# Patient Record
Sex: Female | Born: 1970
Health system: Southern US, Community
[De-identification: ages and names within clinical notes are randomized; demographics above are authoritative.]

## PROBLEM LIST (undated history)

## (undated) DIAGNOSIS — G43909 Migraine, unspecified, not intractable, without status migrainosus: Secondary | ICD-10-CM

## (undated) DIAGNOSIS — F32A Depression, unspecified: Secondary | ICD-10-CM

## (undated) DIAGNOSIS — R001 Bradycardia, unspecified: Secondary | ICD-10-CM

## (undated) DIAGNOSIS — J45909 Unspecified asthma, uncomplicated: Secondary | ICD-10-CM

## (undated) DIAGNOSIS — R55 Syncope and collapse: Secondary | ICD-10-CM

## (undated) DIAGNOSIS — C801 Malignant (primary) neoplasm, unspecified: Secondary | ICD-10-CM

## (undated) DIAGNOSIS — F419 Anxiety disorder, unspecified: Secondary | ICD-10-CM

## (undated) HISTORY — DX: Depression, unspecified: F32.A

## (undated) HISTORY — DX: Migraine, unspecified, not intractable, without status migrainosus: G43.909

## (undated) HISTORY — DX: Unspecified asthma, uncomplicated: J45.909

## (undated) HISTORY — DX: Anxiety disorder, unspecified: F41.9

## (undated) HISTORY — PX: TOE SURGERY: SHX1073

## (undated) HISTORY — PX: COLONOSCOPY: SHX174

## (undated) HISTORY — DX: Bradycardia, unspecified: R00.1

## (undated) HISTORY — DX: Malignant (primary) neoplasm, unspecified: C80.1

## (undated) HISTORY — DX: Syncope and collapse: R55

---
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
## 1999-04-26 ENCOUNTER — Other Ambulatory Visit: Admission: RE | Admit: 1999-04-26 | Discharge: 1999-04-26 | Payer: Self-pay | Admitting: Obstetrics and Gynecology

## 1999-09-29 ENCOUNTER — Encounter: Payer: Self-pay | Admitting: Orthopedic Surgery

## 1999-09-29 ENCOUNTER — Ambulatory Visit (HOSPITAL_COMMUNITY): Admission: RE | Admit: 1999-09-29 | Discharge: 1999-09-29 | Payer: Self-pay | Admitting: Orthopedic Surgery

## 2000-06-21 ENCOUNTER — Other Ambulatory Visit: Admission: RE | Admit: 2000-06-21 | Discharge: 2000-06-21 | Payer: Self-pay | Admitting: Obstetrics and Gynecology

## 2001-06-01 ENCOUNTER — Other Ambulatory Visit: Admission: RE | Admit: 2001-06-01 | Discharge: 2001-06-01 | Payer: Self-pay | Admitting: Obstetrics and Gynecology

## 2001-10-29 ENCOUNTER — Other Ambulatory Visit: Admission: RE | Admit: 2001-10-29 | Discharge: 2001-10-29 | Payer: Self-pay | Admitting: Obstetrics & Gynecology

## 2002-12-17 ENCOUNTER — Other Ambulatory Visit: Admission: RE | Admit: 2002-12-17 | Discharge: 2002-12-17 | Payer: Self-pay | Admitting: Obstetrics & Gynecology

## 2006-02-28 ENCOUNTER — Encounter (INDEPENDENT_AMBULATORY_CARE_PROVIDER_SITE_OTHER): Payer: Self-pay | Admitting: *Deleted

## 2006-02-28 ENCOUNTER — Ambulatory Visit (HOSPITAL_COMMUNITY): Admission: RE | Admit: 2006-02-28 | Discharge: 2006-02-28 | Payer: Self-pay | Admitting: *Deleted

## 2006-06-19 ENCOUNTER — Ambulatory Visit (HOSPITAL_COMMUNITY): Admission: RE | Admit: 2006-06-19 | Discharge: 2006-06-19 | Payer: Self-pay | Admitting: *Deleted

## 2007-01-12 ENCOUNTER — Encounter: Admission: RE | Admit: 2007-01-12 | Discharge: 2007-01-12 | Payer: Self-pay | Admitting: Family Medicine

## 2007-02-19 ENCOUNTER — Encounter (INDEPENDENT_AMBULATORY_CARE_PROVIDER_SITE_OTHER): Payer: Self-pay | Admitting: *Deleted

## 2007-02-19 ENCOUNTER — Ambulatory Visit (HOSPITAL_COMMUNITY): Admission: RE | Admit: 2007-02-19 | Discharge: 2007-02-19 | Payer: Self-pay | Admitting: *Deleted

## 2010-04-07 ENCOUNTER — Ambulatory Visit: Payer: Self-pay | Admitting: Internal Medicine

## 2010-04-07 ENCOUNTER — Observation Stay (HOSPITAL_COMMUNITY): Admission: EM | Admit: 2010-04-07 | Discharge: 2010-04-08 | Payer: Self-pay | Admitting: Emergency Medicine

## 2010-04-08 ENCOUNTER — Encounter (INDEPENDENT_AMBULATORY_CARE_PROVIDER_SITE_OTHER): Payer: Self-pay | Admitting: Emergency Medicine

## 2010-04-08 HISTORY — PX: DOBUTAMINE STRESS ECHO: SHX5426

## 2010-04-16 ENCOUNTER — Emergency Department (HOSPITAL_COMMUNITY): Admission: EM | Admit: 2010-04-16 | Discharge: 2010-04-16 | Payer: Self-pay | Admitting: Emergency Medicine

## 2010-04-19 ENCOUNTER — Ambulatory Visit: Payer: Self-pay | Admitting: Cardiovascular Disease

## 2010-11-19 LAB — URINE CULTURE
Culture  Setup Time: 201108121802
Culture: NO GROWTH

## 2010-11-19 LAB — DIFFERENTIAL
Basophils Relative: 0 % (ref 0–1)
Basophils Relative: 0 % (ref 0–1)
Eosinophils Absolute: 0.2 10*3/uL (ref 0.0–0.7)
Eosinophils Absolute: 0.2 10*3/uL (ref 0.0–0.7)
Eosinophils Relative: 2 % (ref 0–5)
Eosinophils Relative: 2 % (ref 0–5)
Lymphs Abs: 2.4 10*3/uL (ref 0.7–4.0)
Lymphs Abs: 2.5 10*3/uL (ref 0.7–4.0)
Monocytes Absolute: 0.5 10*3/uL (ref 0.1–1.0)
Monocytes Relative: 6 % (ref 3–12)
Neutrophils Relative %: 61 % (ref 43–77)

## 2010-11-19 LAB — BASIC METABOLIC PANEL
BUN: 13 mg/dL (ref 6–23)
CO2: 22 mEq/L (ref 19–32)
CO2: 24 mEq/L (ref 19–32)
Calcium: 8.8 mg/dL (ref 8.4–10.5)
Calcium: 8.9 mg/dL (ref 8.4–10.5)
Chloride: 106 mEq/L (ref 96–112)
Chloride: 110 mEq/L (ref 96–112)
Creatinine, Ser: 0.58 mg/dL (ref 0.4–1.2)
GFR calc Af Amer: >60 mL/min (ref >60)
GFR calc Af Amer: >60 mL/min (ref >60)
GFR calc non Af Amer: >60 mL/min (ref >60)
Glucose, Bld: 82 mg/dL (ref 70–99)
Glucose, Bld: 86 mg/dL (ref 70–99)
Potassium: 3.2 mEq/L — ABNORMAL LOW (ref 3.5–5.1)
Sodium: 138 mEq/L (ref 135–145)
Sodium: 139 mEq/L (ref 135–145)

## 2010-11-19 LAB — CK TOTAL AND CKMB (NOT AT ARMC)
CK, MB: 2.3 ng/mL (ref 0.3–4.0)
Relative Index: INVALID (ref 0.0–2.5)
Total CK: 55 U/L (ref 7–177)
Total CK: 65 U/L (ref 7–177)

## 2010-11-19 LAB — POCT CARDIAC MARKERS
CKMB, poc: <1 ng/mL — ABNORMAL LOW (ref 1.0–8.0)
Myoglobin, poc: 27.5 ng/mL (ref 12–200)
Myoglobin, poc: 30.3 ng/mL (ref 12–200)
Troponin i, poc: <0.05 ng/mL (ref 0.00–0.09)
Troponin i, poc: <0.05 ng/mL (ref 0.00–0.09)

## 2010-11-19 LAB — CBC
HCT: 38.5 % (ref 36.0–46.0)
Hemoglobin: 12.8 g/dL (ref 12.0–15.0)
MCH: 29 pg (ref 26.0–34.0)
MCHC: 33.2 g/dL (ref 30.0–36.0)
MCV: 86.5 fL (ref 78.0–100.0)
MCV: 87.3 fL (ref 78.0–100.0)
Platelets: 206 10*3/uL (ref 150–400)
RBC: 4.41 MIL/uL (ref 3.87–5.11)
RDW: 12.5 % (ref 11.5–15.5)
WBC: 9.1 10*3/uL (ref 4.0–10.5)

## 2010-11-19 LAB — URINALYSIS, ROUTINE W REFLEX MICROSCOPIC
Glucose, UA: NEGATIVE mg/dL
Ketones, ur: NEGATIVE mg/dL
Leukocytes, UA: NEGATIVE
Nitrite: NEGATIVE
pH: 5.5 (ref 5.0–8.0)

## 2010-11-19 LAB — POCT I-STAT, CHEM 8
BUN: 13 mg/dL (ref 6–23)
Calcium, Ion: 1.1 mmol/L — ABNORMAL LOW (ref 1.12–1.32)
TCO2: 22 mmol/L (ref 0–100)

## 2010-11-19 LAB — URINE MICROSCOPIC-ADD ON

## 2010-11-19 LAB — POCT PREGNANCY, URINE: Preg Test, Ur: NEGATIVE

## 2010-11-19 LAB — TROPONIN I: Troponin I: 0.01 ng/mL (ref 0.00–0.06)

## 2011-01-18 NOTE — Op Note (Signed)
[name], Amy Poole              ACCOUNT NO.:  [id number]   MEDICAL RECORD NO.:  [id number]          PATIENT TYPE:  AMB   LOCATION:  SDC                           FACILITY:  [facility]   PHYSICIAN:  [place]burn B. Earlene Plater, M.D.  DATE OF BIRTH:  Oct 13, 1970   DATE OF PROCEDURE:  02/19/2007  DATE OF DISCHARGE:                               OPERATIVE REPORT   PREOPERATIVE DIAGNOSES:  1. Endometrial polyp.  2. Irregular bleeding.   POSTOPERATIVE DIAGNOSES:  1. Endometrial polyp.  2. Irregular bleeding.   PROCEDURE:  Hysteroscopy with resection of endometrial polyp.   SURGEON:  Marina Gravel, MD   ASSISTANT:  None.   ANESTHESIA:  MAC and 10 mL of 1% Nesacaine.   SPECIMENS:  Polyp to pathology.   BLOOD LOSS:  Minimal.   FLUID DEFICIT:  50 mL of sorbitol.   COMPLICATIONS:  Unintentional removal of left Essure implant.  The  patient will be advised of this and the need for a different type of  birth control in the future.   INDICATION:  The patient with a history of endometrial polyps and  previously noted small uterine septum at previous hysteroscopy for  Essure and incidental polyp removal.  The patient returned with a recent  history of irregular bleeding.  Ultrasound showed a focal mass;  sonohysterogram confirmed apparent endometrial polyp.  The patient  advised of the risks of surgery including infection, bleeding, damage to  surrounding organs.   PROCEDURE:  The patient taken to the operating room and MAC anesthesia  obtained.  She was prepped and draped in standard fashion.  Bladder  emptied with an in and out catheter.  Exam under anesthesia showed an  anteverted uterus, no adnexal masses.   Speculum inserted, paracervical block placed, cervix grasped on its  anterior lip and dilated to #21.  Diagnostic hysteroscope was inserted  after being flushed.  Good distention obtained, and both Essure implants  were noted to be in their proper position, each with about 2-3 coils  visible.  There was a polyp arising from the midline of the fundal  region.  My concern was using electrocautery in the presence of the  Essure implants if unintentionally touched could cause a problem by  conduction of current to surrounding structures such as bowel.  Therefore, I initially attempted to remove the polyp with the Randall-  Stone forceps.  I kept the tips of the forceps in the midline, opened  very minimally, and very gently attempted to remove the polyp.  This,  unfortunately, apparently snagged the left Essure implant as it was  removed. It was removed with minimal to almost no resistance.  It was  removed completely, as its entire length from tip to tip was visible  after removal.  Therefore, the cervix was dilated up to allow passage of  the small resectoscope, and the polyp was resected at its base very  carefully, ensuring staying away from the right tube so as not to touch  the implant.   The patient tolerated the procedure well, and otherwise there were no  complications.  She was taken  to the recovery room in a well current and  stable condition.  She will be advised of the findings and issue with  her Essure implant.  I will recommend additional form of birth control  going forward in the future.      Gerri Spore B. Earlene Plater, M.D.  Electronically Signed     WBD/MEDQ  D:  02/19/2007  T:  02/19/2007  Job:  [id number]

## 2011-01-21 NOTE — Op Note (Signed)
Amy Poole, Amy Poole              ACCOUNT NO.:  [id number]   MEDICAL RECORD NO.:  [id number]          PATIENT TYPE:  AMB   LOCATION:  SDC                           FACILITY:  [facility]   PHYSICIAN:  [place] B. Earlene Plater, M.D.  DATE OF BIRTH:  23-Dec-1970   DATE OF PROCEDURE:  02/28/2006  DATE OF DISCHARGE:                                 OPERATIVE REPORT   PREOPERATIVE DIAGNOSIS:  Desires tubal sterilization.   POSTOPERATIVE DIAGNOSES:  Desires tubal sterilization, endometrial polyp,  uterine septum.   PROCEDURE:  Essure tubal sterilization, endometrial polyp removal.   SURGEON:  Marina Gravel, M.D.   ANESTHESIA:  MAC and 20 cc 1% Nesacaine paracervical block.   SPECIMENS:  Endometrial polyp submitted to pathology.   BLOOD LOSS:  Minimal.   FLUID DEFICIT:  100 cc.   COMPLICATIONS:  None.   INDICATION:  The patient desires permanent tubal sterilization.  Was advised  of the failure rate and ectopic risk and potential need for laparoscopy  should Essure placement be unsuccessful.  The patient is aware of the need  for three months of backup birth control, as well as the FDA mandated  hysterosalpingogram at three months postop.  The patient was advised of the  risks of the surgery including infection, bleeding, perforation, damage to  tissue and surrounding organs.   PROCEDURE:  The patient was taken to the operating room and MAC anesthesia  obtained.  She was prepped and draped in standard fashion.  Bladder emptied  with in-and-out catheter.  Exam under anesthesia showed an anteverted normal-  sized uterus, no adnexal masses.   Paracervical block placed.  Single-tooth tenaculum attached to the anterior  lip of the cervix, the Essure scope inserted and a uterine septum  encountered.  Both sides were explored and each tubal ostia was readily  visible.  In addition, there was an endometrial polyp on the left side.   The right side was approached first.  The Essure device inserted in  the  tubal ostia to the black depth marker and released in standard fashion.  Five coils were visible after placement.  The procedure was repeated on the  left side in the exact same manner and five coils were visible after  placement.  The endometrial polyp was at the fundus on the left side, just  to the left of the septum, and was removed with the grasping forceps through  the Essure scope.  Given that the uterine septum was asymptomatic, it was  left untreated.  Instruments were removed and cervix hemostatic.   The patient tolerated the procedure well without complications.  She was  taken to the recovery room, awake, alert, in stable condition.      Gerri Spore B. Earlene Plater, M.D.  Electronically Signed     WBD/MEDQ  D:  02/28/2006  T:  02/28/2006  Job:  [id number]

## 2011-06-22 LAB — DIFFERENTIAL
Basophils Relative: 1
Eosinophils Absolute: 0.2
Monocytes Absolute: 0.4
Monocytes Relative: 7

## 2011-06-22 LAB — CBC
Hemoglobin: 13
MCHC: 34.2
MCV: 84.9
RBC: 4.49
RDW: 12.6

## 2011-09-05 ENCOUNTER — Ambulatory Visit: Payer: Self-pay

## 2011-09-05 DIAGNOSIS — S40029A Contusion of unspecified upper arm, initial encounter: Secondary | ICD-10-CM

## 2012-01-11 ENCOUNTER — Ambulatory Visit: Payer: 59 | Admitting: Family Medicine

## 2012-01-11 VITALS — BP 114/71 | HR 48 | Temp 98.0°F | Resp 18 | Ht 64.0 in | Wt 179.0 lb

## 2012-01-11 DIAGNOSIS — K645 Perianal venous thrombosis: Secondary | ICD-10-CM

## 2012-01-11 NOTE — Progress Notes (Signed)
  Patient Name: Amy Poole Date of Birth: [birth year] Medical Record Number: [id number] Gender: female Date of Encounter: 01/11/2012  History of Present Illness:  Amy Poole is a 41 y.o. very pleasant female patient who presents with the following:  Here today with hemorrhoids which started to flare up about 10 days ago- then last Thursday they started to bleed. She has bright red blood with BM and sometimes in between as well.  She does not have a lot of pain- however she did have significant pain when the flare- up started.    She is on her feet a lot at work and runs for exercise.    She has had hemorrhoids off and on since her teen years.   She has been stressed especially as her marriage came apart last week.   She uses an IUD for contraception- will be due to have removed soon.    There is no problem list on file for this patient.  No past medical history on file. No past surgical history on file. History  Substance Use Topics  . Smoking status: Never Smoker   . Smokeless tobacco: Not on file  . Alcohol Use: Not on file   No family history on file. Allergies  Allergen Reactions  . Codeine   . Penicillins     Medication list has been reviewed and updated.  Review of Systems: As per HPI- otherwise negative. No other symptoms, abdominal pain, etc  Physical Examination: Filed Vitals:   01/11/12 1008  BP: 114/71  Pulse: 48  Temp: 98 F (36.7 C)  TempSrc: Oral  Resp: 18  Height: 5\' 4"  (1.626 m)  Weight: 179 lb (81.194 kg)    Body mass index is 30.73 kg/(m^2).   GEN: WDWN, NAD, Non-toxic, Alert & Oriented x 3 HEENT: Atraumatic, Normocephalic.  Ears and Nose: No external deformity. EXTR: No clubbing/cyanosis/edema NEURO: Normal gait.  PSYCH: Normally interactive. Conversant. Not depressed or anxious appearing.  Calm demeanor.  Rectal: there is a thrombosed hemmorhoid with a visible blood clot- the hemorrhoid has already opened but clot is  retained  Procedure: VC obtained. Anesthesia with a small amount of 1% lidocaine.  Squeezed hemorrhoid and used forceps to remove clot- extended open area very slightly with 11 blade  Assessment and Plan: 1. Hemorrhoid thrombosis    Treated thrombosed hemorrhoid as above.  Discussed sits baths and keeping stool soft.she will follow- up if she is not doing better- Sooner if worse.

## 2012-01-23 ENCOUNTER — Encounter: Payer: Self-pay | Admitting: *Deleted

## 2013-01-03 ENCOUNTER — Ambulatory Visit (INDEPENDENT_AMBULATORY_CARE_PROVIDER_SITE_OTHER): Payer: BC Managed Care – PPO | Admitting: General Surgery

## 2013-01-03 ENCOUNTER — Encounter (INDEPENDENT_AMBULATORY_CARE_PROVIDER_SITE_OTHER): Payer: Self-pay | Admitting: General Surgery

## 2013-01-03 VITALS — BP 122/90 | HR 61 | Temp 97.1°F | Ht 64.0 in | Wt 183.8 lb

## 2013-01-03 DIAGNOSIS — L0591 Pilonidal cyst without abscess: Secondary | ICD-10-CM

## 2013-01-03 NOTE — Progress Notes (Signed)
Patient ID: Amy Poole, female   DOB: [birth year], 42 y.o.   MRN: [id number]  Chief Complaint  Patient presents with  . New Evaluation    eval pil cyst    HPI Amy Poole is a 42 y.o. female.  This patient is referred by Dr. Collins Scotland for evaluation of a pilonidal cyst. She says that she has had this for several years and about one time per year this will get swollen and inflamed and required incision and drainage. She says that she's had these lanced about 7-8 times. After she has the drainage of her symptoms seem to improve and then she does not have any drainage he in between episodes. Most recently this occurred about 2 weeks ago and she treated this with warming the area and she did not require incision and drainage. She denies any fever or chills or other associated symptoms HPI  Past Medical History  Diagnosis Date  . Syncope and collapse   . Bradycardia   . Chest pain     Past Surgical History  Procedure Laterality Date  . Dobutamine stress echo  04/08/2010    NORMAL  . Appendectomy  07/1983    Family History  Problem Relation Age of Onset  . Hypertension Mother   . Diabetes Father   . Heart disease Father   . Cancer Paternal Grandmother     breast    Social History History  Substance Use Topics  . Smoking status: Never Smoker   . Smokeless tobacco: Not on file  . Alcohol Use: Yes     Comment: 1-2 month    Allergies  Allergen Reactions  . Codeine   . Penicillins     Current Outpatient Prescriptions  Medication Sig Dispense Refill  . albuterol (PROVENTIL) (2.5 MG/3ML) 0.083% nebulizer solution Take 2.5 mg by nebulization every 6 (six) hours as needed.      Marland Kitchen aspirin-acetaminophen-caffeine (EXCEDRIN MIGRAINE) 250-250-65 MG per tablet Take 1 tablet by mouth every 6 (six) hours as needed for pain.      . cetirizine (ZYRTEC) 10 MG tablet Take 10 mg by mouth daily.       No current facility-administered medications for this visit.    Review of  Systems Review of Systems All other review of systems negative or noncontributory except as stated in the HPI  Blood pressure 122/90, pulse 61, temperature 97.1 F (36.2 C), temperature source Temporal, height 5\' 4"  (1.626 m), weight 183 lb 12.8 oz (83.371 kg), SpO2 98.00%.  Physical Exam Physical Exam Physical Exam  Nursing note and vitals reviewed. Constitutional: She is oriented to person, place, and time. She appears well-developed and well-nourished. No distress.  HENT:  Head: Normocephalic and atraumatic.  Mouth/Throat: No oropharyngeal exudate.  Eyes: Conjunctivae and EOM are normal. Pupils are equal, round, and reactive to light. Right eye exhibits no discharge. Left eye exhibits no discharge. No scleral icterus.  Neck: Normal range of motion. Neck supple. No tracheal deviation present.  Cardiovascular: Normal rate, regular rhythm, normal heart sounds and intact distal pulses.   Pulmonary/Chest: Effort normal and breath sounds normal. No stridor. No respiratory distress. She has no wheezes.  Abdominal: Soft. Bowel sounds are normal. She exhibits no distension and no mass. There is no tenderness. There is no rebound and no guarding.  Musculoskeletal: Normal range of motion. She exhibits no edema and no tenderness.  Neurological: She is alert and oriented to person, place, and time.  Skin: Skin is warm and dry. No rash  noted. She is not diaphoretic. No erythema. No pallor. She hasno tenderness in the area of concern. She does have some well-healed scars just to the left of midline which are well healed. There is no evidence of any active infection or drainage. She does have a midline sinus consistent with a pilonidal cyst. There is no evidence of erythema or induration or tenderness. Psychiatric: She has a normal mood and affect. Her behavior is normal. Judgment and thought content normal.    Data Reviewed Outside records   Assessment    Pilonidal cyst with history of  abscess She has fairly classic pilonidal cyst from her history as well as on exam. Fortunately, there is no evidence of active infection currently. We had a discussion about the options for continued observation versus elective pilonidal cystectomy in think that she would like to proceed with pilonidal cystectomy. However she would like to wait on scheduling this for now until she can work with her insurance and with her work to find at the time to have this repaired. We did discuss the procedure and its risks including infection, bleeding, pain, scarring, recurrence, chronic wound. She expressed understanding of the high risk of wound infection for this procedure and I will wait to hear from her about when she would like to have this scheduled     Plan    We will plan for elective pilonidal cystectomy at her convenience        Lodema Pilot DAVID 01/03/2013, 3:20 PM

## 2013-11-18 ENCOUNTER — Encounter: Payer: Self-pay | Admitting: Diagnostic Neuroimaging

## 2013-11-18 ENCOUNTER — Ambulatory Visit (INDEPENDENT_AMBULATORY_CARE_PROVIDER_SITE_OTHER): Payer: Self-pay | Admitting: Diagnostic Neuroimaging

## 2013-11-18 ENCOUNTER — Encounter (INDEPENDENT_AMBULATORY_CARE_PROVIDER_SITE_OTHER): Payer: Self-pay

## 2013-11-18 VITALS — BP 107/70 | HR 69 | Ht 64.0 in | Wt 188.0 lb

## 2013-11-18 DIAGNOSIS — R5383 Other fatigue: Secondary | ICD-10-CM

## 2013-11-18 DIAGNOSIS — R200 Anesthesia of skin: Secondary | ICD-10-CM

## 2013-11-18 DIAGNOSIS — R5381 Other malaise: Secondary | ICD-10-CM

## 2013-11-18 DIAGNOSIS — R209 Unspecified disturbances of skin sensation: Secondary | ICD-10-CM

## 2013-11-18 DIAGNOSIS — R531 Weakness: Secondary | ICD-10-CM

## 2013-11-18 NOTE — Patient Instructions (Signed)
I will check MRI brain.  Try to reduce your rigorous work schedule to no more than 50-60 hours per week.

## 2013-11-18 NOTE — Progress Notes (Signed)
[facility]  PATIENT: Amy Poole DOB: 1970-12-12  REFERRING CLINICIAN: Cobb HISTORY FROM: patient  REASON FOR VISIT: new consult   HISTORICAL  CHIEF COMPLAINT:  Chief Complaint  Patient presents with  . Neurologic Problem    Numbness, tingling in arms and feet..#6    HISTORY OF PRESENT ILLNESS:   43 year old left-handed female here for evaluation of numbness and tingling.  For past 5-6 weeks patient has had a left greater than right, hand and arm, feet numbness and tingling. Symptoms started 1 day 5-6 weeks ago. Symptoms have been consistent since that time. Patient feels some mild weakness diffusely. Also having some dizziness, blurred vision and weight loss.  Other factors include significant increase in hours worked per week since January 2015. Patient previously was working 50-60 hours per week. Now she is working 90 hours per week, 6-7 days per week. Patient is getting 5 hours of sleep on 3-4 nights per week.  REVIEW OF SYSTEMS: Full 14 system review of systems performed and notable only for numbness weakness dizziness passing out blurred vision weight loss. 15 pound weight loss since January.  ALLERGIES: Allergies  Allergen Reactions  . Codeine   . Penicillins     HOME MEDICATIONS: Outpatient Prescriptions Prior to Visit  Medication Sig Dispense Refill  . albuterol (PROVENTIL) (2.5 MG/3ML) 0.083% nebulizer solution Take 2.5 mg by nebulization every 6 (six) hours as needed.      Marland Kitchen aspirin-acetaminophen-caffeine (EXCEDRIN MIGRAINE) 250-250-65 MG per tablet Take 1 tablet by mouth every 6 (six) hours as needed for pain.      . cetirizine (ZYRTEC) 10 MG tablet Take 10 mg by mouth daily.       No facility-administered medications prior to visit.    PAST MEDICAL HISTORY: Past Medical History  Diagnosis Date  . Syncope and collapse   . Bradycardia   . Chest pain     PAST SURGICAL HISTORY: Past Surgical History  Procedure Laterality Date   . Dobutamine stress echo  04/08/2010    NORMAL  . Appendectomy  07/1983    FAMILY HISTORY: Family History  Problem Relation Age of Onset  . Hypertension Mother   . Diabetes Father   . Heart disease Father   . Cancer Paternal Grandmother     breast    SOCIAL HISTORY:  History   Social History  . Marital Status: Legally Separated    Spouse Name: N/A    Number of Children: 0  . Years of Education: 12th   Occupational History  . MANAGER    Social History Main Topics  . Smoking status: Never Smoker   . Smokeless tobacco: Not on file  . Alcohol Use: Yes     Comment: 1-2 month  . Drug Use: No  . Sexual Activity: Not on file   Other Topics Concern  . Not on file   Social History Narrative  . No narrative on file     PHYSICAL EXAM  Filed Vitals:   11/18/13 1009  BP: 107/70  Pulse: 69  Height: _0  (1.626 m)  Weight: 188 lb (85.276 kg)    Not recorded    Body mass index is 32.25 kg/(m^2).  GENERAL EXAM: Patient is in no distress; well developed, nourished and groomed; neck is supple; APPEARS EXHAUSTED. SOFT SPOKEN.  CARDIOVASCULAR: Regular rate and rhythm, no murmurs, no carotid bruits  NEUROLOGIC: MENTAL STATUS: awake, alert, oriented to person, place and time, recent and remote memory intact, normal attention and concentration,  language fluent, comprehension intact, naming intact, fund of knowledge appropriate CRANIAL NERVE: no papilledema on fundoscopic exam, pupils equal and reactive to light, visual fields full to confrontation, extraocular muscles intact, no nystagmus, facial sensation and strength symmetric, hearing intact, palate elevates symmetrically, uvula midline, shoulder shrug symmetric, tongue midline. MOTOR: normal bulk and tone, full strength in the BUE, BLE SENSORY: normal and symmetric to light touch, pinprick, temperature, vibration COORDINATION: finger-nose-finger, fine finger movements normal REFLEXES: deep tendon reflexes present and  symmetric GAIT/STATION: narrow based gait; able to walk on toes, heels and tandem; romberg is negative    DIAGNOSTIC DATA (LABS, IMAGING, TESTING) - I reviewed patient records, labs, notes, testing and imaging myself where available.  Lab Results  Component Value Date   WBC 8.1 04/16/2010   HGB 12.8 04/16/2010   HCT 38.5 04/16/2010   MCV 87.3 04/16/2010   PLT 200 04/16/2010      Component Value Date/Time   NA 139 04/16/2010 1644   K 3.8 04/16/2010 1644   CL 110 04/16/2010 1644   CO2 24 04/16/2010 1644   GLUCOSE 86 04/16/2010 1644   BUN 14 04/16/2010 1644   CREATININE 0.58 04/16/2010 1644   CALCIUM 8.8 04/16/2010 1644   GFRNONAA >60 04/16/2010 1644   GFRAA  Value: >60        The eGFR has been calculated using the MDRD equation. This calculation has not been validated in all clinical situations. eGFR's persistently <60 mL/min signify possible Chronic Kidney Disease. 04/16/2010 1644   No results found for this basename: CHOL,  HDL,  LDLCALC,  LDLDIRECT,  TRIG,  CHOLHDL   No results found for this basename: HGBA1C   No results found for this basename: VITAMINB12   No results found for this basename: TSH   B12 465 A1C 4.9 CBC, IRON, CMP - normal  ASSESSMENT AND PLAN  43 y.o. year old female here with new onset numbness in left greater than right side, mainly in hands and feet. Neurologic examination unremarkable. Patient is significantly exhausted related to her rigorous work schedule.  Ddx: CNS autoimmune, inflammatory, structural, metabolic, sleep deprivation, overexertion  PLAN: - MRI brain - Try to reduce work schedule and increase sleep  Orders Placed This Encounter  Procedures  . MR Brain Wo Contrast   Return in about 6 weeks (around 12/30/2013).    Penni Bombard, MD 03/24/9105, 81:66 AM Certified in Neurology, Neurophysiology and Neuroimaging  [facility] [address] [place], [place] [address] [phone]

## 2013-11-27 DIAGNOSIS — R209 Unspecified disturbances of skin sensation: Secondary | ICD-10-CM

## 2013-11-28 ENCOUNTER — Ambulatory Visit
Admission: RE | Admit: 2013-11-28 | Discharge: 2013-11-28 | Disposition: A | Discharge status: Home / Self Care | Payer: PRIVATE HEALTH INSURANCE | Source: Ambulatory Visit | Attending: Diagnostic Neuroimaging | Admitting: Diagnostic Neuroimaging

## 2013-11-28 DIAGNOSIS — R531 Weakness: Secondary | ICD-10-CM

## 2013-11-28 DIAGNOSIS — R200 Anesthesia of skin: Secondary | ICD-10-CM

## 2013-12-10 ENCOUNTER — Telehealth: Payer: Self-pay | Admitting: Diagnostic Neuroimaging

## 2013-12-10 NOTE — Telephone Encounter (Signed)
I spoke to the patient and gave the MRI results. She is requesting a letter to be addressed to her work place to be mailed to her home address advising to limit her work hours to less than 50 per week. She was advised to keep her scheduled appointment with Dr. Corwin Levins   in a few weeks

## 2013-12-10 NOTE — Telephone Encounter (Signed)
Pt called would like for someone to call her back concerning her MRI that was done on 11/28/13. Thanks

## 2013-12-10 NOTE — Telephone Encounter (Signed)
Pt is calling requesting MRI results, sending to [facility], Dr. Leonie Man, please advise

## 2013-12-11 ENCOUNTER — Encounter: Payer: Self-pay | Admitting: *Deleted

## 2013-12-11 NOTE — Telephone Encounter (Signed)
Mailed letter to pt

## 2013-12-18 ENCOUNTER — Encounter: Payer: Self-pay | Admitting: Diagnostic Neuroimaging

## 2013-12-18 ENCOUNTER — Ambulatory Visit (INDEPENDENT_AMBULATORY_CARE_PROVIDER_SITE_OTHER): Payer: Self-pay | Admitting: Diagnostic Neuroimaging

## 2013-12-18 VITALS — BP 108/70 | HR 80 | Ht 64.0 in | Wt 188.0 lb

## 2013-12-18 DIAGNOSIS — R200 Anesthesia of skin: Secondary | ICD-10-CM

## 2013-12-18 DIAGNOSIS — R209 Unspecified disturbances of skin sensation: Secondary | ICD-10-CM

## 2013-12-18 NOTE — Progress Notes (Signed)
[facility]  PATIENT: Amy Poole DOB: 1971-02-27  REFERRING CLINICIAN: Cobb HISTORY FROM: patient  REASON FOR VISIT: new consult   HISTORICAL  CHIEF COMPLAINT:  Chief Complaint  Patient presents with  . Follow-up    numbness, weakness    HISTORY OF PRESENT ILLNESS:   UPDATE 12/18/13: Symptoms are slightly improving. Work schedule has been slightly better. Testing results reviewed. Still with numbness in hands and feet.   PRIOR HPI (11/18/13:  43 year old left-handed female here for evaluation of numbness and tingling. For past 5-6 weeks patient has had a left greater than right, hand and arm, feet numbness and tingling. Symptoms started 1 day 5-6 weeks ago. Symptoms have been consistent since that time. Patient feels some mild weakness diffusely. Also having some dizziness, blurred vision and weight loss. Other factors include significant increase in hours worked per week since January 2015. Patient previously was working 50-60 hours per week. Now she is working 90 hours per week, 6-7 days per week. Patient is getting 5 hours of sleep on 3-4 nights per week.   REVIEW OF SYSTEMS: Full 14 system review of systems performed and notable only for numbness weakness dizziness passing out blurred vision weight loss. 15 pound weight loss since January.  ALLERGIES: Allergies  Allergen Reactions  . Codeine   . Penicillins     HOME MEDICATIONS: Outpatient Prescriptions Prior to Visit  Medication Sig Dispense Refill  . albuterol (PROVENTIL) (2.5 MG/3ML) 0.083% nebulizer solution Take 2.5 mg by nebulization every 6 (six) hours as needed.      Marland Kitchen aspirin-acetaminophen-caffeine (EXCEDRIN MIGRAINE) 250-250-65 MG per tablet Take 1 tablet by mouth every 6 (six) hours as needed for pain.      . cetirizine (ZYRTEC) 10 MG tablet Take 10 mg by mouth daily.      . cyclobenzaprine (FLEXERIL) 10 MG tablet Take 10 mg by mouth 3 (three) times daily as needed for muscle spasms.       Marland Kitchen ibuprofen (ADVIL,MOTRIN) 600 MG tablet Take 600 mg by mouth every 6 (six) hours as needed.       No facility-administered medications prior to visit.    PAST MEDICAL HISTORY: Past Medical History  Diagnosis Date  . Syncope and collapse   . Bradycardia   . Chest pain     PAST SURGICAL HISTORY: Past Surgical History  Procedure Laterality Date  . Dobutamine stress echo  04/08/2010    NORMAL  . Appendectomy  07/1983    FAMILY HISTORY: Family History  Problem Relation Age of Onset  . Hypertension Mother   . Diabetes Father   . Heart disease Father   . Cancer Paternal Grandmother     breast    SOCIAL HISTORY:  History   Social History  . Marital Status: Legally Separated    Spouse Name: N/A    Number of Children: 0  . Years of Education: 12th   Occupational History  . MANAGER     [employer]   Social History Main Topics  . Smoking status: Never Smoker   . Smokeless tobacco: Never Used  . Alcohol Use: Yes     Comment: 1-2 month  . Drug Use: No  . Sexual Activity: Not on file   Other Topics Concern  . Not on file   Social History Narrative   Patient lives at home alone.   Caffeine Use: 1-2 sodas every other day     PHYSICAL EXAM  Filed Vitals:   12/18/13 1438  BP: 108/70  Pulse: 80  Height: 5' 4"  (1.626 m)  Weight: 188 lb (85.276 kg)    Not recorded    Body mass index is 32.25 kg/(m^2).  GENERAL EXAM: Patient is in no distress; well developed, nourished and groomed; neck is supple; APPEARS LESS EXHAUSTED. SOFT SPOKEN.  CARDIOVASCULAR: Regular rate and rhythm, no murmurs, no carotid bruits  NEUROLOGIC: MENTAL STATUS: awake, alert, oriented to person, place and time, recent and remote memory intact, normal attention and concentration, language fluent, comprehension intact, naming intact, fund of knowledge appropriate CRANIAL NERVE: no papilledema on fundoscopic exam, pupils equal and reactive to light, visual fields full to  confrontation, extraocular muscles intact, no nystagmus, facial sensation and strength symmetric, hearing intact, palate elevates symmetrically, uvula midline, shoulder shrug symmetric, tongue midline. MOTOR: normal bulk and tone, full strength in the BUE, BLE SENSORY: normal and symmetric to light touch, pinprick, temperature, vibration COORDINATION: finger-nose-finger, fine finger movements normal REFLEXES: deep tendon reflexes present and symmetric GAIT/STATION: narrow based gait; able to walk on toes, heels and tandem; romberg is negative    DIAGNOSTIC DATA (LABS, IMAGING, TESTING) - I reviewed patient records, labs, notes, testing and imaging myself where available.  Lab Results  Component Value Date   WBC 8.1 04/16/2010   HGB 12.8 04/16/2010   HCT 38.5 04/16/2010   MCV 87.3 04/16/2010   PLT 200 04/16/2010      Component Value Date/Time   NA 139 04/16/2010 1644   K 3.8 04/16/2010 1644   CL 110 04/16/2010 1644   CO2 24 04/16/2010 1644   GLUCOSE 86 04/16/2010 1644   BUN 14 04/16/2010 1644   CREATININE 0.58 04/16/2010 1644   CALCIUM 8.8 04/16/2010 1644   GFRNONAA >60 04/16/2010 1644   GFRAA  Value: >60        The eGFR has been calculated using the MDRD equation. This calculation has not been validated in all clinical situations. eGFR's persistently <60 mL/min signify possible Chronic Kidney Disease. 04/16/2010 1644   No results found for this basename: CHOL,  HDL,  LDLCALC,  LDLDIRECT,  TRIG,  CHOLHDL   No results found for this basename: HGBA1C   No results found for this basename: VITAMINB12   No results found for this basename: TSH   B12 465 A1C 4.9 CBC, IRON, CMP - normal  11/28/13 MRI BRAIN - normal   ASSESSMENT AND PLAN  43 y.o. year old female here with new onset numbness in left greater than right side, mainly in hands and feet. Neurologic examination unremarkable. Patient has been significantly exhausted related to her rigorous work schedule. Fortunately, symptoms and  work schedule are improving slowly.  Ddx: sleep deprivation vs overexertion   PLAN: - continue to balance work/life/sleep schedules  Return in about 6 months (around 06/19/2014), or if symptoms worsen or fail to improve.    Penni Bombard, MD 8/38/1840, 3:75 PM Certified in Neurology, Neurophysiology and Neuroimaging  [facility] [address], [place], [place] [address] [phone]

## 2013-12-18 NOTE — Patient Instructions (Signed)
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

## 2013-12-30 ENCOUNTER — Ambulatory Visit: Payer: Self-pay | Admitting: Diagnostic Neuroimaging

## 2014-06-18 ENCOUNTER — Ambulatory Visit: Payer: Self-pay | Admitting: Diagnostic Neuroimaging

## 2014-12-01 ENCOUNTER — Ambulatory Visit (INDEPENDENT_AMBULATORY_CARE_PROVIDER_SITE_OTHER): Payer: 59 | Admitting: Internal Medicine

## 2014-12-01 VITALS — BP 110/70 | HR 68 | Temp 98.2°F | Resp 16 | Ht 66.0 in | Wt 193.0 lb

## 2014-12-01 DIAGNOSIS — R059 Cough, unspecified: Secondary | ICD-10-CM

## 2014-12-01 DIAGNOSIS — R05 Cough: Secondary | ICD-10-CM

## 2014-12-01 DIAGNOSIS — J0140 Acute pansinusitis, unspecified: Secondary | ICD-10-CM

## 2014-12-01 MED ORDER — AZITHROMYCIN 500 MG PO TABS: 500.0000 mg | ORAL_TABLET | Freq: Every day | ORAL | Status: DC

## 2014-12-01 NOTE — Patient Instructions (Signed)
Asthma Attack Prevention Although there is no way to prevent asthma from starting, you can take steps to control the disease and reduce its symptoms. Learn about your asthma and how to control it. Take an active role to control your asthma by working with your health care provider to create and follow an asthma action plan. An asthma action plan guides you in:  Taking your medicines properly.  Avoiding things that set off your asthma or make your asthma worse (asthma triggers).  Tracking your level of asthma control.  Responding to worsening asthma.  Seeking emergency care when needed. To track your asthma, keep records of your symptoms, check your peak flow number using a handheld device that shows how well air moves out of your lungs (peak flow meter), and get regular asthma checkups.  WHAT ARE SOME WAYS TO PREVENT AN ASTHMA ATTACK?  Take medicines as directed by your health care provider.  Keep track of your asthma symptoms and level of control.  With your health care provider, write a detailed plan for taking medicines and managing an asthma attack. Then be sure to follow your action plan. Asthma is an ongoing condition that needs regular monitoring and treatment.  Identify and avoid asthma triggers. Many outdoor allergens and irritants (such as pollen, mold, cold air, and air pollution) can trigger asthma attacks. Find out what your asthma triggers are and take steps to avoid them.  Monitor your breathing. Learn to recognize warning signs of an attack, such as coughing, wheezing, or shortness of breath. Your lung function may decrease before you notice any signs or symptoms, so regularly measure and record your peak airflow with a home peak flow meter.  Identify and treat attacks early. If you act quickly, you are less likely to have a severe attack. You will also need less medicine to control your symptoms. When your peak flow measurements decrease and alert you to an upcoming attack,  take your medicine as instructed and immediately stop any activity that may have triggered the attack. If your symptoms do not improve, get medical help.  Pay attention to increasing quick-relief inhaler use. If you find yourself relying on your quick-relief inhaler, your asthma is not under control. See your health care provider about adjusting your treatment. WHAT CAN MAKE MY SYMPTOMS WORSE? A number of common things can set off or make your asthma symptoms worse and cause temporary increased inflammation of your airways. Keep track of your asthma symptoms for several weeks, detailing all the environmental and emotional factors that are linked with your asthma. When you have an asthma attack, go back to your asthma diary to see which factor, or combination of factors, might have contributed to it. Once you know what these factors are, you can take steps to control many of them. If you have allergies and asthma, it is important to take asthma prevention steps at home. Minimizing contact with the substance to which you are allergic will help prevent an asthma attack. Some triggers and ways to avoid these triggers are: Animal Dander:  Some people are allergic to the flakes of skin or dried saliva from animals with fur or feathers.   There is no such thing as a hypoallergenic dog or cat breed. All dogs or cats can cause allergies, even if they don't shed.  Keep these pets out of your home.  If you are not able to keep a pet outdoors, keep the pet out of your bedroom and other sleeping areas at all   times, and keep the door closed.  Remove carpets and furniture covered with cloth from your home. If that is not possible, keep the pet away from fabric-covered furniture and carpets. Dust Mites: Many people with asthma are allergic to dust mites. Dust mites are tiny bugs that are found in every home in mattresses, pillows, carpets, fabric-covered furniture, bedcovers, clothes, stuffed toys, and other  fabric-covered items.   Cover your mattress in a special dust-proof cover.  Cover your pillow in a special dust-proof cover, or wash the pillow each week in hot water. Water must be hotter than 130 F (54.4 C) to kill dust mites. Cold or warm water used with detergent and bleach can also be effective.  Wash the sheets and blankets on your bed each week in hot water.  Try not to sleep or lie on cloth-covered cushions.  Call ahead when traveling and ask for a smoke-free hotel room. Bring your own bedding and pillows in case the hotel only supplies feather pillows and down comforters, which may contain dust mites and cause asthma symptoms.  Remove carpets from your bedroom and those laid on concrete, if you can.  Keep stuffed toys out of the bed, or wash the toys weekly in hot water or cooler water with detergent and bleach. Cockroaches: Many people with asthma are allergic to the droppings and remains of cockroaches.   Keep food and garbage in closed containers. Never leave food out.  Use poison baits, traps, powders, gels, or paste (for example, boric acid).  If a spray is used to kill cockroaches, stay out of the room until the odor goes away. Indoor Mold:  Fix leaky faucets, pipes, or other sources of water that have mold around them.  Clean floors and moldy surfaces with a fungicide or diluted bleach.  Avoid using humidifiers, vaporizers, or swamp coolers. These can spread molds through the air. Pollen and Outdoor Mold:  When pollen or mold spore counts are high, try to keep your windows closed.  Stay indoors with windows closed from late morning to afternoon. Pollen and some mold spore counts are highest at that time.  Ask your health care provider whether you need to take anti-inflammatory medicine or increase your dose of the medicine before your allergy season starts. Other Irritants to Avoid:  Tobacco smoke is an irritant. If you smoke, ask your health care provider how  you can quit. Ask family members to quit smoking, too. Do not allow smoking in your home or car.  If possible, do not use a wood-burning stove, kerosene heater, or fireplace. Minimize exposure to all sources of smoke, including incense, candles, fires, and fireworks.  Try to stay away from strong odors and sprays, such as perfume, talcum powder, hair spray, and paints.  Decrease humidity in your home and use an indoor air cleaning device. Reduce indoor humidity to below 60%. Dehumidifiers or central air conditioners can do this.  Decrease house dust exposure by changing furnace and air cooler filters frequently.  Try to have someone else vacuum for you once or twice a week. Stay out of rooms while they are being vacuumed and for a short while afterward.  If you vacuum, use a dust mask from a hardware store, a double-layered or microfilter vacuum cleaner bag, or a vacuum cleaner with a HEPA filter.  Sulfites in foods and beverages can be irritants. Do not drink beer or wine or eat dried fruit, processed potatoes, or shrimp if they cause asthma symptoms.  Cold   air can trigger an asthma attack. Cover your nose and mouth with a scarf on cold or windy days.  Several health conditions can make asthma more difficult to manage, including a runny nose, sinus infections, reflux disease, psychological stress, and sleep apnea. Work with your health care provider to manage these conditions.  Avoid close contact with people who have a respiratory infection such as a cold or the flu, since your asthma symptoms may get worse if you catch the infection. Wash your hands thoroughly after touching items that may have been handled by people with a respiratory infection.  Get a flu shot every year to protect against the flu virus, which often makes asthma worse for days or weeks. Also get a pneumonia shot if you have not previously had one. Unlike the flu shot, the pneumonia shot does not need to be given  yearly. Medicines:  Talk to your health care provider about whether it is safe for you to take aspirin or non-steroidal anti-inflammatory medicines (NSAIDs). In a small number of people with asthma, aspirin and NSAIDs can cause asthma attacks. These medicines must be avoided by people who have known aspirin-sensitive asthma. It is important that people with aspirin-sensitive asthma read labels of all over-the-counter medicines used to treat pain, colds, coughs, and fever.  Beta-blockers and ACE inhibitors are other medicines you should discuss with your health care provider. HOW CAN I FIND OUT WHAT I AM ALLERGIC TO? Ask your asthma health care provider about allergy skin testing or blood testing (the RAST test) to identify the allergens to which you are sensitive. If you are found to have allergies, the most important thing to do is to try to avoid exposure to any allergens that you are sensitive to as much as possible. Other treatments for allergies, such as medicines and allergy shots (immunotherapy) are available.  CAN I EXERCISE? Follow your health care provider's advice regarding asthma treatment before exercising. It is important to maintain a regular exercise program, but vigorous exercise or exercise in cold, humid, or dry environments can cause asthma attacks, especially for those people who have exercise-induced asthma. Document Released: 08/10/2009 Document Revised: 08/27/2013 Document Reviewed: 02/27/2013 [facility] Patient Information 2015 [place], [place]. This information is not intended to replace advice given to you by your health care provider. Make sure you discuss any questions you have with your health care provider. Sinusitis Sinusitis is redness, soreness, and inflammation of the paranasal sinuses. Paranasal sinuses are air pockets within the bones of your face (beneath the eyes, the middle of the forehead, or above the eyes). In healthy paranasal sinuses, mucus is able to drain out,  and air is able to circulate through them by way of your nose. However, when your paranasal sinuses are inflamed, mucus and air can become trapped. This can allow bacteria and other germs to grow and cause infection. Sinusitis can develop quickly and last only a short time (acute) or continue over a long period (chronic). Sinusitis that lasts for more than 12 weeks is considered chronic.  CAUSES  Causes of sinusitis include:  Allergies.  Structural abnormalities, such as displacement of the cartilage that separates your nostrils (deviated septum), which can decrease the air flow through your nose and sinuses and affect sinus drainage.  Functional abnormalities, such as when the small hairs (cilia) that line your sinuses and help remove mucus do not work properly or are not present. SIGNS AND SYMPTOMS  Symptoms of acute and chronic sinusitis are the same. The primary  symptoms are pain and pressure around the affected sinuses. Other symptoms include:  Upper toothache.  Earache.  Headache.  Bad breath.  Decreased sense of smell and taste.  A cough, which worsens when you are lying flat.  Fatigue.  Fever.  Thick drainage from your nose, which often is green and may contain pus (purulent).  Swelling and warmth over the affected sinuses. DIAGNOSIS  Your health care provider will perform a physical exam. During the exam, your health care provider may:  Look in your nose for signs of abnormal growths in your nostrils (nasal polyps).  Tap over the affected sinus to check for signs of infection.  View the inside of your sinuses (endoscopy) using an imaging device that has a light attached (endoscope). If your health care provider suspects that you have chronic sinusitis, one or more of the following tests may be recommended:  Allergy tests.  Nasal culture. A sample of mucus is taken from your nose, sent to a lab, and screened for bacteria.  Nasal cytology. A sample of mucus is  taken from your nose and examined by your health care provider to determine if your sinusitis is related to an allergy. TREATMENT  Most cases of acute sinusitis are related to a viral infection and will resolve on their own within 10 days. Sometimes medicines are prescribed to help relieve symptoms (pain medicine, decongestants, nasal steroid sprays, or saline sprays).  However, for sinusitis related to a bacterial infection, your health care provider will prescribe antibiotic medicines. These are medicines that will help kill the bacteria causing the infection.  Rarely, sinusitis is caused by a fungal infection. In theses cases, your health care provider will prescribe antifungal medicine. For some cases of chronic sinusitis, surgery is needed. Generally, these are cases in which sinusitis recurs more than 3 times per year, despite other treatments. HOME CARE INSTRUCTIONS   Drink plenty of water. Water helps thin the mucus so your sinuses can drain more easily.  Use a humidifier.  Inhale steam 3 to 4 times a day (for example, sit in the bathroom with the shower running).  Apply a warm, moist washcloth to your face 3 to 4 times a day, or as directed by your health care provider.  Use saline nasal sprays to help moisten and clean your sinuses.  Take medicines only as directed by your health care provider.  If you were prescribed either an antibiotic or antifungal medicine, finish it all even if you start to feel better. SEEK IMMEDIATE MEDICAL CARE IF:  You have increasing pain or severe headaches.  You have nausea, vomiting, or drowsiness.  You have swelling around your face.  You have vision problems.  You have a stiff neck.  You have difficulty breathing. MAKE SURE YOU:   Understand these instructions.  Will watch your condition.  Will get help right away if you are not doing well or get worse. Document Released: 08/22/2005 Document Revised: 01/06/2014 Document Reviewed:  09/06/2011 [facility] Patient Information 2015 [place], [place]. This information is not intended to replace advice given to you by your health care provider. Make sure you discuss any questions you have with your health care provider.

## 2014-12-01 NOTE — Progress Notes (Signed)
   Subjective:    Patient ID: Amy Poole, female    DOB: [birth year], 44 y.o.   MRN: [id number]  HPI 2-3d of congestion, cough. No sob,cp. Hx of asthma ,rarely uses albuterol.    Review of Systems     Objective:   Physical Exam  Constitutional: She is oriented to person, place, and time. She appears well-developed and well-nourished. No distress.  HENT:  Head: Normocephalic.  Right Ear: External ear normal.  Left Ear: External ear normal.  Nose: Mucosal edema and rhinorrhea present. Right sinus exhibits maxillary sinus tenderness and frontal sinus tenderness. Left sinus exhibits frontal sinus tenderness.  Mouth/Throat: Oropharynx is clear and moist.  Eyes: Conjunctivae are normal. Pupils are equal, round, and reactive to light.  Neck: Normal range of motion.  Cardiovascular: Normal rate, regular rhythm and normal heart sounds.   Pulmonary/Chest: Effort normal and breath sounds normal. She has no wheezes. She exhibits no tenderness.  Lymphadenopathy:    She has no cervical adenopathy.  Neurological: She is alert and oriented to person, place, and time. She exhibits normal muscle tone. Coordination normal.  Psychiatric: She has a normal mood and affect.  Vitals reviewed.         Assessment & Plan:  Sinusitis/Cough Zithromax/Delsym

## 2015-01-28 ENCOUNTER — Ambulatory Visit (INDEPENDENT_AMBULATORY_CARE_PROVIDER_SITE_OTHER): Payer: 59 | Admitting: Physician Assistant

## 2015-01-28 VITALS — BP 130/68 | HR 84 | Temp 98.9°F | Resp 16 | Ht 65.5 in | Wt 194.5 lb

## 2015-01-28 DIAGNOSIS — J45909 Unspecified asthma, uncomplicated: Secondary | ICD-10-CM | POA: Insufficient documentation

## 2015-01-28 DIAGNOSIS — J309 Allergic rhinitis, unspecified: Secondary | ICD-10-CM

## 2015-01-28 DIAGNOSIS — J029 Acute pharyngitis, unspecified: Secondary | ICD-10-CM

## 2015-01-28 DIAGNOSIS — J4520 Mild intermittent asthma, uncomplicated: Secondary | ICD-10-CM | POA: Diagnosis not present

## 2015-01-28 DIAGNOSIS — J039 Acute tonsillitis, unspecified: Secondary | ICD-10-CM

## 2015-01-28 LAB — POCT CBC
Granulocyte percent: 85 %G — AB (ref 37–80)
HCT, POC: 43.4 % (ref 37.7–47.9)
HEMOGLOBIN: 13.9 g/dL (ref 12.2–16.2)
LYMPH, POC: 0.9 (ref 0.6–3.4)
MCH, POC: 27.7 pg (ref 27–31.2)
MCHC: 32.1 g/dL (ref 31.8–35.4)
MCV: 86.4 fL (ref 80–97)
MID (CBC): 0.3 (ref 0–0.9)
MPV: 6.8 fL (ref 0–99.8)
POC Granulocyte: 6.8 (ref 2–6.9)
POC LYMPH %: 11.2 % (ref 10–50)
POC MID %: 3.8 % (ref 0–12)
Platelet Count, POC: 222 10*3/uL (ref 142–424)
RBC: 5.03 M/uL (ref 4.04–5.48)
RDW, POC: 12.6 %
WBC: 8 10*3/uL (ref 4.6–10.2)

## 2015-01-28 LAB — POCT RAPID STREP A (OFFICE): RAPID STREP A SCREEN: NEGATIVE

## 2015-01-28 MED ORDER — MAGIC MOUTHWASH W/LIDOCAINE: 10.0000 mL | ORAL | Status: DC | PRN

## 2015-01-28 MED ORDER — ALBUTEROL SULFATE HFA 108 (90 BASE) MCG/ACT IN AERS: 2.0000 | INHALATION_SPRAY | RESPIRATORY_TRACT | Status: AC | PRN

## 2015-01-28 MED ORDER — AZITHROMYCIN 250 MG PO TABS: ORAL_TABLET | ORAL | Status: AC

## 2015-01-28 NOTE — Patient Instructions (Addendum)
Take zpak until finished. Gargle mouthwash every 2-3 hours as needed for pain. Do not swallow. Continue ibuprofen as needed. Rest and hydrate. Return if not getting better after antibiotic.

## 2015-01-28 NOTE — Progress Notes (Signed)
Subjective:    Patient ID: Amy Poole, female    DOB: 30-Oct-1970, 44 y.o.   MRN: [id number]  HPI  This is a 44 year old female with PMH asthma and allergic rhinitis who is presenting with sore throat x 4 days. She has felt feverish and chilled. Denies cough, nasal congestion, otalgia. Denies sick contacts. States she has been sleep deprived from moving to a new house. States she feels "run down". Has tried ibuprofen and has helped. Uses albuterol once or twice a month for asthma. Takes zyrtec daily for env allergies. No flare in allergies recently. Has had strep throat 3 times in her life. Pt needing a refill of her albuterol.  Review of Systems  Constitutional: Positive for fever and chills.  HENT: Positive for sore throat. Negative for congestion, ear pain and sinus pressure.   Eyes: Negative for redness.  Respiratory: Negative for cough, shortness of breath and wheezing.   Gastrointestinal: Negative for nausea, vomiting, abdominal pain and diarrhea.  Skin: Negative for rash.  Allergic/Immunologic: Positive for environmental allergies.  Hematological: Negative for adenopathy.   There are no active problems to display for this patient.  Prior to Admission medications   Medication Sig Start Date End Date Taking? Authorizing Provider  albuterol (PROVENTIL) (2.5 MG/3ML) 0.083% nebulizer solution Take 2.5 mg by nebulization every 6 (six) hours as needed.   Yes Historical Provider, MD  aspirin-acetaminophen-caffeine (EXCEDRIN MIGRAINE) [phone] MG per tablet Take 1 tablet by mouth every 6 (six) hours as needed for pain.   Yes Historical Provider, MD  cetirizine (ZYRTEC) 10 MG tablet Take 10 mg by mouth daily.   Yes Historical Provider, MD  cyclobenzaprine (FLEXERIL) 10 MG tablet Take 10 mg by mouth 3 (three) times daily as needed for muscle spasms.   Yes Historical Provider, MD  ibuprofen (ADVIL,MOTRIN) 600 MG tablet Take 600 mg by mouth every 6 (six) hours as needed.   Yes Historical  Provider, MD   Allergies  Allergen Reactions  . Codeine   . Penicillins    Patient's social and family history were reviewed.     Objective:   Physical Exam  Constitutional: She is oriented to person, place, and time. She appears well-developed and well-nourished. No distress.  HENT:  Head: Normocephalic and atraumatic.  Right Ear: Hearing, external ear and ear canal normal. Tympanic membrane is retracted.  Left Ear: Hearing, external ear and ear canal normal. Tympanic membrane is retracted.  Nose: Nose normal.  Mouth/Throat: Uvula is midline and mucous membranes are normal. Oropharyngeal exudate, posterior oropharyngeal edema and posterior oropharyngeal erythema present. No tonsillar abscesses.  Eyes: Conjunctivae and lids are normal. Right eye exhibits no discharge. Left eye exhibits no discharge. No scleral icterus.  Cardiovascular: Normal rate, regular rhythm, normal heart sounds and normal pulses.   No murmur heard. Pulmonary/Chest: Effort normal and breath sounds normal. No respiratory distress. She has no wheezes. She has no rhonchi. She has no rales.  Musculoskeletal: Normal range of motion.  Lymphadenopathy:       Head (right side): No submental, no submandibular and no tonsillar adenopathy present.       Head (left side): No submental, no submandibular and no tonsillar adenopathy present.    She has cervical adenopathy (bilateral, anterior).  Neurological: She is alert and oriented to person, place, and time.  Skin: Skin is warm, dry and intact. No lesion and no rash noted.  Psychiatric: She has a normal mood and affect. Her speech is normal and behavior  is normal. Thought content normal.   BP 130/68 mmHg  Pulse 84  Temp(Src) 98.9 F (37.2 C) (Oral)  Resp 16  Ht 5' 5.5" (1.664 m)  Wt 194 lb 8 oz (88.225 kg)  BMI 31.86 kg/m2  SpO2 98%  Results for orders placed or performed in visit on 01/28/15  POCT CBC  Result Value Ref Range   WBC 8.0 4.6 - 10.2 K/uL   Lymph,  poc 0.9 0.6 - 3.4   POC LYMPH PERCENT 11.2 10 - 50 %L   MID (cbc) 0.3 0 - 0.9   POC MID % 3.8 0 - 12 %M   POC Granulocyte 6.8 2 - 6.9   Granulocyte percent 85.0 (A) 37 - 80 %G   RBC 5.03 4.04 - 5.48 M/uL   Hemoglobin 13.9 12.2 - 16.2 g/dL   HCT, POC 43.4 37.7 - 47.9 %   MCV 86.4 80 - 97 fL   MCH, POC 27.7 27 - 31.2 pg   MCHC 32.1 31.8 - 35.4 g/dL   RDW, POC 12.6 %   Platelet Count, POC 222 142 - 424 K/uL   MPV 6.8 0 - 99.8 fL  POCT rapid strep A  Result Value Ref Range   Rapid Strep A Screen Negative Negative       Assessment & Plan:  1. Sore throat 2. Acute tonsillitis D/t exudative tonsillitis, cervical adenopathy and lack of other URI symptoms, will treat tonsillitis with zpak even though rapid strep negative. Culture pending. Mouthwash for symptoms. She will return if symptoms not getting better after course of abx. - POCT CBC - POCT rapid strep A - Culture, Group A Strep - azithromycin (ZITHROMAX) 250 MG tablet; Take 2 tabs PO x 1 dose, then 1 tab PO QD x 4 days  Dispense: 6 tablet; Refill: 0 - Alum & Mag Hydroxide-Simeth (MAGIC MOUTHWASH W/LIDOCAINE) SOLN; Take 10 mLs by mouth every 2 (two) hours as needed for mouth pain.  Dispense: 360 mL; Refill: 0  3. Asthma, chronic, mild intermittent, uncomplicated Albuterol refilled. - albuterol (PROVENTIL HFA;VENTOLIN HFA) 108 (90 BASE) MCG/ACT inhaler; Inhale 2 puffs into the lungs every 4 (four) hours as needed for wheezing or shortness of breath (cough, shortness of breath or wheezing.).  Dispense: 1 Inhaler; Refill: 11  4. Allergic rhinitis, unspecified allergic rhinitis type Continue daily zyrtec.   Benjaman Pott Drenda Freeze, MHS [facility]  01/28/2015

## 2015-01-30 ENCOUNTER — Ambulatory Visit (INDEPENDENT_AMBULATORY_CARE_PROVIDER_SITE_OTHER): Payer: 59 | Admitting: Emergency Medicine

## 2015-01-30 VITALS — BP 128/70 | HR 103 | Temp 99.4°F | Resp 17 | Ht 65.0 in | Wt 191.8 lb

## 2015-01-30 DIAGNOSIS — J039 Acute tonsillitis, unspecified: Secondary | ICD-10-CM | POA: Diagnosis not present

## 2015-01-30 LAB — CULTURE, GROUP A STREP: Organism ID, Bacteria: NORMAL

## 2015-01-30 MED ORDER — CEFPROZIL 500 MG PO TABS: 500.0000 mg | ORAL_TABLET | Freq: Two times a day (BID) | ORAL | Status: AC

## 2015-01-30 MED ORDER — HYDROCODONE-ACETAMINOPHEN 5-325 MG PO TABS: 1.0000 | ORAL_TABLET | ORAL | Status: DC | PRN

## 2015-01-30 NOTE — Progress Notes (Signed)
Subjective:  Patient ID: Amy Poole, female    DOB: March 25, 1971  Age: 44 y.o. MRN: [id number]  CC: Sore Throat   HPI Amy Poole presents for  Patient was seen in the office and treated for pharyngitis with Zithromax. Since that time she had no improvement and increased pain in her oropharynx and no difficulty swallowing she has no nausea vomiting cough coryza. She has no stool change. She's having some difficulty  Swallowing. She is drinking liquids however. She has no rash.    her intolerance to penicillin as his vomiting. She's had no anaphylactic symptoms.  Outpatient Prescriptions Prior to Visit  Medication Sig Dispense Refill  . albuterol (PROVENTIL HFA;VENTOLIN HFA) 108 (90 BASE) MCG/ACT inhaler Inhale 2 puffs into the lungs every 4 (four) hours as needed for wheezing or shortness of breath (cough, shortness of breath or wheezing.). 1 Inhaler 11  . Alum & Mag Hydroxide-Simeth (MAGIC MOUTHWASH W/LIDOCAINE) SOLN Take 10 mLs by mouth every 2 (two) hours as needed for mouth pain. 360 mL 0  . azithromycin (ZITHROMAX) 250 MG tablet Take 2 tabs PO x 1 dose, then 1 tab PO QD x 4 days 6 tablet 0  . cetirizine (ZYRTEC) 10 MG tablet Take 10 mg by mouth daily.    Marland Kitchen ibuprofen (ADVIL,MOTRIN) 600 MG tablet Take 600 mg by mouth every 6 (six) hours as needed.    Marland Kitchen aspirin-acetaminophen-caffeine (EXCEDRIN MIGRAINE) 250-250-65 MG per tablet Take 1 tablet by mouth every 6 (six) hours as needed for pain.    . cyclobenzaprine (FLEXERIL) 10 MG tablet Take 10 mg by mouth 3 (three) times daily as needed for muscle spasms.     No facility-administered medications prior to visit.    History   Social History  . Marital Status: Divorced    Spouse Name: N/A  . Number of Children: 0  . Years of Education: 12th   Occupational History  . MANAGER     [employer]   Social History Main Topics  . Smoking status: Never Smoker   . Smokeless tobacco: Never Used  . Alcohol Use: Yes     Comment:  1-2 month  . Drug Use: No  . Sexual Activity: Not on file   Other Topics Concern  . None   Social History Narrative   Patient lives at home alone.   Caffeine Use: 1-2 sodas every other day    Family History  Problem Relation Age of Onset  . Hypertension Mother   . Diabetes Father   . Heart disease Father   . Cancer Paternal Grandmother     breast    Past Medical History  Diagnosis Date  . Syncope and collapse   . Bradycardia   . Chest pain      Review of Systems  Constitutional: Negative for fever, chills and appetite change.  HENT: Positive for sore throat and trouble swallowing. Negative for congestion, ear pain, postnasal drip and sinus pressure.   Eyes: Negative for pain and redness.  Respiratory: Negative for cough, shortness of breath and wheezing.   Cardiovascular: Negative for leg swelling.  Gastrointestinal: Negative for nausea, vomiting, abdominal pain, diarrhea, constipation and blood in stool.  Endocrine: Negative for polyuria.  Genitourinary: Negative for dysuria, urgency, frequency and flank pain.  Musculoskeletal: Negative for gait problem.  Skin: Negative for rash.  Neurological: Negative for weakness and headaches.  Psychiatric/Behavioral: Negative for confusion and decreased concentration. The patient is not nervous/anxious.     Objective:  BP  128/70 mmHg  Pulse 103  Temp(Src) 99.4 F (37.4 C) (Oral)  Resp 17  Ht 5\' 5"  (1.651 m)  Wt 191 lb 12.8 oz (87 kg)  BMI 31.92 kg/m2  SpO2 98%  LMP 01/19/2015  BP Readings from Last 3 Encounters:  01/30/15 128/70  01/28/15 130/68  12/01/14 110/70    Wt Readings from Last 3 Encounters:  01/30/15 191 lb 12.8 oz (87 kg)  01/28/15 194 lb 8 oz (88.225 kg)  12/01/14 193 lb (87.544 kg)    Physical Exam  Constitutional: She is oriented to person, place, and time. She appears well-developed and well-nourished. She appears distressed.  HENT:  Head: Normocephalic and atraumatic.  Mouth/Throat:  Oropharyngeal exudate present.  Eyes: Conjunctivae are normal. Pupils are equal, round, and reactive to light.  Neck: Normal range of motion. Neck supple.  Cardiovascular: Normal rate and regular rhythm.   Pulmonary/Chest: Effort normal.  Musculoskeletal: She exhibits no edema.  Lymphadenopathy:    She has cervical adenopathy.  Neurological: She is alert and oriented to person, place, and time.  Skin: Skin is dry.  Psychiatric: She has a normal mood and affect. Her behavior is normal. Thought content normal.    Lab Results  Component Value Date   WBC 8.0 01/28/2015   HGB 13.9 01/28/2015   HCT 43.4 01/28/2015   PLT 200 04/16/2010   GLUCOSE 86 04/16/2010   NA 139 04/16/2010   K 3.8 04/16/2010   CL 110 04/16/2010   CREATININE 0.58 04/16/2010   BUN 14 04/16/2010   CO2 24 04/16/2010      .  Assessment & Plan:   Amy Poole was seen today for sore throat.  Diagnoses and all orders for this visit:  Acute tonsillitis  Other orders -     HYDROcodone-acetaminophen (NORCO) 5-325 MG per tablet; Take 1-2 tablets by mouth every 4 (four) hours as needed. -     cefPROZIL (CEFZIL) 500 MG tablet; Take 1 tablet (500 mg total) by mouth 2 (two) times daily.   I am having [name] start on HYDROcodone-acetaminophen and cefPROZIL. I am also having her maintain her cetirizine, aspirin-acetaminophen-caffeine, cyclobenzaprine, ibuprofen, azithromycin, albuterol, and magic mouthwash w/lidocaine.  Meds ordered this encounter  Medications  . HYDROcodone-acetaminophen (NORCO) 5-325 MG per tablet    Sig: Take 1-2 tablets by mouth every 4 (four) hours as needed.    Dispense:  30 tablet    Refill:  0  . cefPROZIL (CEFZIL) 500 MG tablet    Sig: Take 1 tablet (500 mg total) by mouth 2 (two) times daily.    Dispense:  20 tablet    Refill:  0     Follow-up: Return if symptoms worsen or fail to improve.  Roselee Culver, MD

## 2015-01-30 NOTE — Patient Instructions (Signed)

## 2015-02-27 ENCOUNTER — Ambulatory Visit (INDEPENDENT_AMBULATORY_CARE_PROVIDER_SITE_OTHER): Payer: 59 | Admitting: Emergency Medicine

## 2015-02-27 VITALS — BP 110/70 | HR 71 | Temp 98.4°F | Resp 18 | Ht 65.5 in | Wt 191.0 lb

## 2015-02-27 DIAGNOSIS — R002 Palpitations: Secondary | ICD-10-CM | POA: Diagnosis not present

## 2015-02-27 LAB — CBC
HEMATOCRIT: 38.4 % (ref 36.0–46.0)
Hemoglobin: 13.1 g/dL (ref 12.0–15.0)
MCH: 28.9 pg (ref 26.0–34.0)
MCHC: 34.1 g/dL (ref 30.0–36.0)
MCV: 84.8 fL (ref 78.0–100.0)
MPV: 9.9 fL (ref 8.6–12.4)
Platelets: 183 10*3/uL (ref 150–400)
RBC: 4.53 MIL/uL (ref 3.87–5.11)
RDW: 13 % (ref 11.5–15.5)
WBC: 5.7 10*3/uL (ref 4.0–10.5)

## 2015-02-27 LAB — COMPREHENSIVE METABOLIC PANEL
ALT: 16 U/L (ref 0–35)
AST: 14 U/L (ref 0–37)
Albumin: 3.9 g/dL (ref 3.5–5.2)
Alkaline Phosphatase: 53 U/L (ref 39–117)
BILIRUBIN TOTAL: 1.1 mg/dL (ref 0.2–1.2)
BUN: 18 mg/dL (ref 6–23)
CO2: 25 mEq/L (ref 19–32)
CREATININE: 0.56 mg/dL (ref 0.50–1.10)
Calcium: 9.3 mg/dL (ref 8.4–10.5)
Chloride: 107 mEq/L (ref 96–112)
Glucose, Bld: 76 mg/dL (ref 70–99)
POTASSIUM: 4 meq/L (ref 3.5–5.3)
SODIUM: 140 meq/L (ref 135–145)
Total Protein: 6.5 g/dL (ref 6.0–8.3)

## 2015-02-27 LAB — TSH: TSH: 1.143 u[IU]/mL (ref 0.350–4.500)

## 2015-02-27 NOTE — Patient Instructions (Signed)

## 2015-02-27 NOTE — Progress Notes (Signed)
Subjective:  Patient ID: Amy Poole, female    DOB: 11/19/1970  Age: 44 y.o. MRN: [id number]  CC: Palpitations   HPI Amy Poole presents  evaluation of palpitation. She said over the last week or so she's had multiple episodes of palpitations and short-lived less than a minute. She said her heart is beating out of her chest. She has no chest pain tightness heaviness pressure. No shortness of breath. No wheezing or cough.. Perioral or digital paresthesias. She has no nausea or vomiting.  She's a nonsmoker. She has no history of hypertension diabetes or high cholesterol. She has no history of accelerated cardiovascular disease in her family. She is premenopausal.  History Amy Poole has a past medical history of Syncope and collapse; Bradycardia; and Chest pain.   She has past surgical history that includes Dobutamine stress echo (04/08/2010) and Appendectomy (07/1983).   Her  family history includes Cancer in her paternal grandmother; Diabetes in her father; Heart disease in her father; Hypertension in her mother.  She   reports that she has never smoked. She has never used smokeless tobacco. She reports that she drinks alcohol. She reports that she does not use illicit drugs.  Outpatient Prescriptions Prior to Visit  Medication Sig Dispense Refill  . albuterol (PROVENTIL HFA;VENTOLIN HFA) 108 (90 BASE) MCG/ACT inhaler Inhale 2 puffs into the lungs every 4 (four) hours as needed for wheezing or shortness of breath (cough, shortness of breath or wheezing.). 1 Inhaler 11  . aspirin-acetaminophen-caffeine (EXCEDRIN MIGRAINE) [phone] MG per tablet Take 1 tablet by mouth every 6 (six) hours as needed for pain.    . cetirizine (ZYRTEC) 10 MG tablet Take 10 mg by mouth daily.    . cyclobenzaprine (FLEXERIL) 10 MG tablet Take 10 mg by mouth 3 (three) times daily as needed for muscle spasms.    Marland Kitchen HYDROcodone-acetaminophen (NORCO) 5-325 MG per tablet Take 1-2 tablets by mouth every 4  (four) hours as needed. (Patient not taking: Reported on 02/27/2015) 30 tablet 0  . ibuprofen (ADVIL,MOTRIN) 600 MG tablet Take 600 mg by mouth every 6 (six) hours as needed.    . Alum & Mag Hydroxide-Simeth (MAGIC MOUTHWASH W/LIDOCAINE) SOLN Take 10 mLs by mouth every 2 (two) hours as needed for mouth pain. 360 mL 0   No facility-administered medications prior to visit.    History   Social History  . Marital Status: Divorced    Spouse Name: N/A  . Number of Children: 0  . Years of Education: 12th   Occupational History  . MANAGER     [employer]   Social History Main Topics  . Smoking status: Never Smoker   . Smokeless tobacco: Never Used  . Alcohol Use: Yes     Comment: 1-2 month  . Drug Use: No  . Sexual Activity: Not on file   Other Topics Concern  . None   Social History Narrative   Patient lives at home alone.   Caffeine Use: 1-2 sodas every other day     Review of Systems  Constitutional: Negative for fever, chills and appetite change.  HENT: Negative for congestion, ear pain, postnasal drip, sinus pressure and sore throat.   Eyes: Negative for pain and redness.  Respiratory: Negative for cough, shortness of breath and wheezing.   Cardiovascular: Positive for palpitations. Negative for leg swelling.  Gastrointestinal: Negative for nausea, vomiting, abdominal pain, diarrhea, constipation and blood in stool.  Endocrine: Negative for polyuria.  Genitourinary: Negative for dysuria, urgency,  frequency and flank pain.  Musculoskeletal: Negative for gait problem.  Skin: Negative for rash.  Neurological: Negative for weakness and headaches.  Psychiatric/Behavioral: Negative for confusion and decreased concentration. The patient is not nervous/anxious.     Objective:  BP 110/70 mmHg  Pulse 71  Temp(Src) 98.4 F (36.9 C) (Oral)  Resp 18  Ht 5' 5.5" (1.664 m)  Wt 191 lb (86.637 kg)  BMI 31.29 kg/m2  SpO2 99%  LMP 01/05/2015  Physical Exam    Constitutional: She is oriented to person, place, and time. She appears well-developed and well-nourished. No distress.  HENT:  Head: Normocephalic and atraumatic.  Right Ear: External ear normal.  Left Ear: External ear normal.  Nose: Nose normal.  Eyes: Conjunctivae and EOM are normal. Pupils are equal, round, and reactive to light. No scleral icterus.  Neck: Normal range of motion. Neck supple. No tracheal deviation present.  Cardiovascular: Normal rate, regular rhythm and normal heart sounds.   Pulmonary/Chest: Effort normal. No respiratory distress. She has no wheezes. She has no rales.  Abdominal: She exhibits no mass. There is no tenderness. There is no rebound and no guarding.  Musculoskeletal: She exhibits no edema.  Lymphadenopathy:    She has no cervical adenopathy.  Neurological: She is alert and oriented to person, place, and time. Coordination normal.  Skin: Skin is warm and dry. No rash noted.  Psychiatric: She has a normal mood and affect. Her behavior is normal.      Assessment & Plan:   Amy Poole was seen today for palpitations.  Diagnoses and all orders for this visit:  Palpitations Orders: -     CBC -     Comprehensive metabolic panel -     TSH -     EKG 12-Lead -     Ambulatory referral to Cardiology   I have discontinued Amy Poole's magic mouthwash w/lidocaine. I am also having her maintain her cetirizine, aspirin-acetaminophen-caffeine, cyclobenzaprine, ibuprofen, albuterol, and HYDROcodone-acetaminophen.  No orders of the defined types were placed in this encounter.   Labs are pending and EKGs been performed and she's been referred to cardiology for evaluation.  Appropriate red flag conditions were discussed with the patient as well as actions that should be taken.  Patient expressed his understanding.  Follow-up: Return if symptoms worsen or fail to improve.  Roselee Culver, MD

## 2015-05-25 ENCOUNTER — Ambulatory Visit (INDEPENDENT_AMBULATORY_CARE_PROVIDER_SITE_OTHER): Payer: 59 | Admitting: Family Medicine

## 2015-05-25 ENCOUNTER — Ambulatory Visit (INDEPENDENT_AMBULATORY_CARE_PROVIDER_SITE_OTHER): Payer: 59

## 2015-05-25 VITALS — BP 102/60 | HR 65 | Temp 98.2°F | Resp 16 | Ht 65.5 in | Wt 193.4 lb

## 2015-05-25 DIAGNOSIS — Z23 Encounter for immunization: Secondary | ICD-10-CM | POA: Diagnosis not present

## 2015-05-25 DIAGNOSIS — M25522 Pain in left elbow: Secondary | ICD-10-CM

## 2015-05-25 DIAGNOSIS — M7712 Lateral epicondylitis, left elbow: Secondary | ICD-10-CM | POA: Diagnosis not present

## 2015-05-25 MED ORDER — MELOXICAM 15 MG PO TABS: 15.0000 mg | ORAL_TABLET | Freq: Every day | ORAL | Status: DC

## 2015-05-25 NOTE — Progress Notes (Signed)
[facility] [address] [place] [address] [phone]  Date:  05/25/2015   Name:  Amy Poole   DOB:  09/21/1970   MRN:  [id number]  PCP:  Florina Ou, MD    Chief Complaint: Elbow Pain and Immunizations   History of Present Illness:  Amy Poole is a 44 y.o. very pleasant female patient who presents with the following:  She noted onset of left elbow pain about 3 weeks ago.  No acute injury, but she notes persistently worsening pain.  It is worse with any activity It can even hurt at night when she is trying to sleep She is wearing a left elbow sleeve. No unusual actibvities She is left handed  She is generally in good health   She is a pt of [facility]- they see her for her back  Patient Active Problem List   Diagnosis Date Noted  . Asthma, chronic 01/28/2015  . Rhinitis, allergic 01/28/2015    Past Medical History  Diagnosis Date  . Syncope and collapse   . Bradycardia   . Chest pain     Past Surgical History  Procedure Laterality Date  . Dobutamine stress echo  04/08/2010    NORMAL  . Appendectomy  07/1983    Social History  Substance Use Topics  . Smoking status: Never Smoker   . Smokeless tobacco: Never Used  . Alcohol Use: Yes     Comment: 1-2 month    Family History  Problem Relation Age of Onset  . Hypertension Mother   . Diabetes Father   . Heart disease Father   . Cancer Paternal Grandmother     breast    Allergies  Allergen Reactions  . Codeine   . Penicillins     Medication list has been reviewed and updated.  Current Outpatient Prescriptions on File Prior to Visit  Medication Sig Dispense Refill  . albuterol (PROVENTIL HFA;VENTOLIN HFA) 108 (90 BASE) MCG/ACT inhaler Inhale 2 puffs into the lungs every 4 (four) hours as needed for wheezing or shortness of breath (cough, shortness of breath or wheezing.). 1 Inhaler 11  . aspirin-acetaminophen-caffeine (EXCEDRIN MIGRAINE) 588-325-49 MG per tablet  Take 1 tablet by mouth every 6 (six) hours as needed for pain.    . cetirizine (ZYRTEC) 10 MG tablet Take 10 mg by mouth daily.    Marland Kitchen ibuprofen (ADVIL,MOTRIN) 600 MG tablet Take 600 mg by mouth every 6 (six) hours as needed.    . cyclobenzaprine (FLEXERIL) 10 MG tablet Take 10 mg by mouth 3 (three) times daily as needed for muscle spasms.    Marland Kitchen HYDROcodone-acetaminophen (NORCO) 5-325 MG per tablet Take 1-2 tablets by mouth every 4 (four) hours as needed. (Patient not taking: Reported on 02/27/2015) 30 tablet 0   No current facility-administered medications on file prior to visit.    Review of Systems:  As per HPI- otherwise negative.   Physical Examination: Filed Vitals:   05/25/15 0812  BP: 102/60  Pulse: 65  Temp: 98.2 F (36.8 C)  Resp: 16   Filed Vitals:   05/25/15 0812  Height: 5' 5.5" (1.664 m)  Weight: 193 lb 6.4 oz (87.726 kg)   Body mass index is 31.68 kg/(m^2). Ideal Body Weight: Weight in (lb) to have BMI = 25: 152.2  GEN: WDWN, NAD, Non-toxic, A & O x 3, overweight, looks well HEENT: Atraumatic, Normocephalic. Neck supple. No masses, No LAD. Ears and Nose: No external deformity. CV: RRR, No  M/G/R. No JVD. No thrill. No extra heart sounds. PULM: CTA B, no wheezes, crackles, rhonchi. No retractions. No resp. distress. No accessory muscle use. EXTR: No c/c/e Left elbow: she has mild tenderness over the lateral epicondyle.  Normal flexion.  Extension however seem to be minimally restricted. Normal pronation and supination; no pain with resisted pronation or supination.   She is wearing an OTC elbow brace of some sort NEURO Normal gait.  PSYCH: Normally interactive. Conversant. Not depressed or anxious appearing.  Calm demeanor.   [facility] reading (PRIMARY) by  Dr. Lorelei Pont. Left elbow: negative  Assessment and Plan: Left elbow pain - Plan: DG Elbow Complete Left, meloxicam (MOBIC) 15 MG tablet, Ambulatory referral to Orthopedic Surgery, CANCELED: Ambulatory referral to  Orthopedic Surgery  Immunization due - Plan: Flu Vaccine QUAD 36+ mos IM  Lateral epicondylitis, left - Plan: Ambulatory referral to Orthopedic Surgery  Flu shot today She has noted elbow pain for several weeks, getting worse.  She would like to go ahead and see ortho- will arrange this for her   Signed Lamar Blinks, MD

## 2015-05-25 NOTE — Patient Instructions (Signed)
We will refer you to [facility] to take a look at your elbow In the meantime try applying some ice to the lateral elbow a few times a day, and use the mobic as needed Remember while you are on mobic do not take other medications such as ibuprofen or aleve Let me know if any concerns!

## 2015-06-25 ENCOUNTER — Telehealth: Payer: Self-pay

## 2015-06-25 NOTE — Telephone Encounter (Signed)
Request processed by x-ray and patient has picked up disc today.

## 2015-06-25 NOTE — Telephone Encounter (Signed)
Verbal request form completed and forwarded to x-ray.

## 2015-06-25 NOTE — Telephone Encounter (Signed)
Requesting x-ray for her elbow for appointment tomorrow.   [phone]

## 2015-12-24 DIAGNOSIS — R5383 Other fatigue: Secondary | ICD-10-CM | POA: Diagnosis not present

## 2015-12-24 DIAGNOSIS — R202 Paresthesia of skin: Secondary | ICD-10-CM | POA: Diagnosis not present

## 2015-12-24 DIAGNOSIS — M2560 Stiffness of unspecified joint, not elsewhere classified: Secondary | ICD-10-CM | POA: Diagnosis not present

## 2016-01-18 DIAGNOSIS — M2550 Pain in unspecified joint: Secondary | ICD-10-CM | POA: Diagnosis not present

## 2016-02-29 DIAGNOSIS — K219 Gastro-esophageal reflux disease without esophagitis: Secondary | ICD-10-CM | POA: Diagnosis not present

## 2016-02-29 DIAGNOSIS — M2550 Pain in unspecified joint: Secondary | ICD-10-CM | POA: Diagnosis not present

## 2016-02-29 DIAGNOSIS — R760 Raised antibody titer: Secondary | ICD-10-CM | POA: Diagnosis not present

## 2016-02-29 DIAGNOSIS — Z791 Long term (current) use of non-steroidal anti-inflammatories (NSAID): Secondary | ICD-10-CM | POA: Diagnosis not present

## 2016-03-15 DIAGNOSIS — M549 Dorsalgia, unspecified: Secondary | ICD-10-CM | POA: Diagnosis not present

## 2016-03-15 DIAGNOSIS — G43009 Migraine without aura, not intractable, without status migrainosus: Secondary | ICD-10-CM | POA: Diagnosis not present

## 2016-04-01 DIAGNOSIS — M5430 Sciatica, unspecified side: Secondary | ICD-10-CM | POA: Diagnosis not present

## 2016-04-01 DIAGNOSIS — M5126 Other intervertebral disc displacement, lumbar region: Secondary | ICD-10-CM | POA: Diagnosis not present

## 2016-04-01 DIAGNOSIS — M5127 Other intervertebral disc displacement, lumbosacral region: Secondary | ICD-10-CM | POA: Diagnosis not present

## 2016-04-21 DIAGNOSIS — M545 Low back pain: Secondary | ICD-10-CM | POA: Diagnosis not present

## 2016-05-16 DIAGNOSIS — M545 Low back pain: Secondary | ICD-10-CM | POA: Diagnosis not present

## 2016-05-26 DIAGNOSIS — M545 Low back pain: Secondary | ICD-10-CM | POA: Diagnosis not present

## 2016-06-02 DIAGNOSIS — M545 Low back pain: Secondary | ICD-10-CM | POA: Diagnosis not present

## 2016-06-20 DIAGNOSIS — G43009 Migraine without aura, not intractable, without status migrainosus: Secondary | ICD-10-CM | POA: Diagnosis not present

## 2016-06-20 DIAGNOSIS — Z23 Encounter for immunization: Secondary | ICD-10-CM | POA: Diagnosis not present

## 2016-08-26 ENCOUNTER — Ambulatory Visit (INDEPENDENT_AMBULATORY_CARE_PROVIDER_SITE_OTHER): Payer: Self-pay

## 2016-08-26 ENCOUNTER — Ambulatory Visit (INDEPENDENT_AMBULATORY_CARE_PROVIDER_SITE_OTHER): Payer: Self-pay | Admitting: Emergency Medicine

## 2016-08-26 VITALS — BP 122/72 | HR 66 | Temp 98.2°F | Resp 17 | Ht 66.0 in | Wt 197.0 lb

## 2016-08-26 DIAGNOSIS — S99922A Unspecified injury of left foot, initial encounter: Secondary | ICD-10-CM

## 2016-08-26 DIAGNOSIS — S93602A Unspecified sprain of left foot, initial encounter: Secondary | ICD-10-CM

## 2016-08-26 NOTE — Progress Notes (Signed)
Dg Foot Complete Left  Result Date: 08/26/2016 CLINICAL DATA:  Pain following injury several days prior EXAM: LEFT FOOT - COMPLETE 3+ VIEW COMPARISON:  None. FINDINGS: Frontal, oblique, and lateral views were obtained. There is no fracture or dislocation. Joint spaces appear normal. No erosive change. There is an accessory ossicle lateral to the cuboid, an anatomic variant. There are small posterior and inferior calcaneal spurs. IMPRESSION: Small calcaneal spurs. No fracture or dislocation. No evident arthropathy. Electronically Signed   By: Lowella Grip III M.D.   On: 08/26/2016 09:54   Amy Poole 45 y.o. Foot Injury   The incident occurred 3 to 5 days ago. The incident occurred in the street. Injury mechanism: running injury. The pain is present in the left foot. The quality of the pain is described as aching. The pain is at a severity of 5/10. The pain has been fluctuating since onset. Associated symptoms include an inability to bear weight. Pertinent negatives include no loss of motion, loss of sensation, muscle weakness, numbness or tingling. The symptoms are aggravated by weight bearing. She has tried NSAIDs for the symptoms. The treatment provided mild relief.    Chief Complaint  Patient presents with  . Foot Injury    left side     HISTORY OF PRESENT ILLNESS: This is a 45 y.o. female complaining of left foot injury/pain sustained while running last Sunday.  Prior to Admission medications   Medication Sig Start Date End Date Taking? Authorizing Provider  albuterol (PROVENTIL HFA;VENTOLIN HFA) 108 (90 BASE) MCG/ACT inhaler Inhale 2 puffs into the lungs every 4 (four) hours as needed for wheezing or shortness of breath (cough, shortness of breath or wheezing.). 01/28/15  Yes Ezekiel Slocumb, PA-C  cetirizine (ZYRTEC) 10 MG tablet Take 10 mg by mouth daily.   Yes Historical Provider, MD  SUMAtriptan (IMITREX) 100 MG tablet Take 100 mg by mouth every 2 (two) hours as needed for  migraine. May repeat in 2 hours if headache persists or recurs.   Yes Historical Provider, MD  tizanidine (ZANAFLEX) 2 MG capsule Take 2 mg by mouth 3 (three) times daily.   Yes Historical Provider, MD    Allergies  Allergen Reactions  . Codeine   . Penicillins     Patient Active Problem List   Diagnosis Date Noted  . Asthma, chronic 01/28/2015  . Rhinitis, allergic 01/28/2015    Past Medical History:  Diagnosis Date  . Bradycardia   . Chest pain   . Syncope and collapse     Past Surgical History:  Procedure Laterality Date  . APPENDECTOMY  07/1983  . DOBUTAMINE STRESS ECHO  04/08/2010   NORMAL    Social History   Social History  . Marital status: Divorced    Spouse name: N/A  . Number of children: 0  . Years of education: 12th   Occupational History  . MANAGER [employer]   Social History Main Topics  . Smoking status: Never Smoker  . Smokeless tobacco: Never Used  . Alcohol use Yes     Comment: 1-2 month  . Drug use: No  . Sexual activity: Not on file   Other Topics Concern  . Not on file   Social History Narrative   Patient lives at home alone.   Caffeine Use: 1-2 sodas every other day    Family History  Problem Relation Age of Onset  . Hypertension Mother   . Diabetes Father   . Heart  disease Father   . Cancer Paternal Grandmother     breast     Review of Systems  Constitutional: Negative.   Respiratory: Negative.   Cardiovascular: Negative.   Gastrointestinal: Negative.   Genitourinary: Negative.   Musculoskeletal:       +pain to left foot with swelling  Skin: Negative.   Neurological: Negative for tingling and numbness.  All other systems reviewed and are negative.    Physical Exam  Constitutional: She is oriented to person, place, and time. She appears well-developed and well-nourished.  HENT:  Head: Normocephalic and atraumatic.  Eyes: Pupils are equal, round, and reactive to light.  Neck: Normal range  of motion.  Cardiovascular: Normal rate.   Pulmonary/Chest: Effort normal.  Musculoskeletal: Normal range of motion.  Left foot: NVI with FROM but has tenderness to posterior foot with mild swelling.  Neurological: She is alert and oriented to person, place, and time.  Skin: Skin is warm and dry.  Psychiatric: She has a normal mood and affect. Her behavior is normal.     ASSESSMENT & PLAN: [name] was seen today for foot injury.  Diagnoses and all orders for this visit:  Injury of left foot, initial encounter -     DG Foot Complete Left -     Care order/instruction:  Foot sprain, left, initial encounter      Agustina Caroli, MD [facility]

## 2016-08-26 NOTE — Patient Instructions (Addendum)
IF you received an x-ray today, you will receive an invoice from [facility]. Please contact [facility] at [phone] with questions or concerns regarding your invoice.   IF you received labwork today, you will receive an invoice from [facility]. Please contact [facility] at [phone] with questions or concerns regarding your invoice.   Our billing staff will not be able to assist you with questions regarding bills from these companies.  You will be contacted with the lab results as soon as they are available. The fastest way to get your results is to activate your My Chart account. Instructions are located on the last page of this paperwork. If you have not heard from [place] regarding the results in 2 weeks, please contact this office.     Foot Sprain Introduction A foot sprain is an injury to one of the strong bands of tissue (ligaments) that connect and support the many bones in your feet. The ligament can be stretched too much or it can tear. A tear can be either partial or complete. The severity of the sprain depends on how much of the ligament was damaged or torn. What are the causes? A foot sprain is usually caused by suddenly twisting or pivoting your foot. What increases the risk? This injury is more likely to occur in people who:  Play a sport, such as basketball or football.  Exercise or play a sport without warming up.  Start a new workout or sport.  Suddenly increase how long or hard they exercise or play a sport. What are the signs or symptoms? Symptoms of this condition start soon after an injury and include:  Pain, especially in the arch of the foot.  Bruising.  Swelling.  Inability to walk or use the foot to support body weight. How is this diagnosed? This condition is diagnosed with a medical history and physical exam. You may also have imaging tests, such as:  X-rays to make sure there are no broken bones (fractures).  MRI to see if  the ligament has torn. How is this treated? Treatment varies depending on the severity of your sprain. Mild sprains can be treated with rest, ice, compression, and elevation (RICE). If your ligament is overstretched or partially torn, treatment usually involves keeping your foot in a fixed position (immobilization) for a period of time. To help you do this, your health care provider will apply a bandage, splint, or walking boot to keep your foot from moving until it heals. You may also be advised to use crutches or a scooter for a few weeks to avoid bearing weight on your foot while it is healing. If your ligament is fully torn, you may need surgery to reconnect the ligament to the bone. After surgery, a cast or splint will be applied and will need to stay on your foot while it heals. Your health care provider may also suggest exercises or physical therapy to strengthen your foot. Follow these instructions at home: If You Have a Bandage, Splint, or Walking Boot:  Wear it as directed by your health care provider. Remove it only as directed by your health care provider.  Loosen the bandage, splint, or walking boot if your toes become numb and tingle, or if they turn cold and blue. Bathing  If your health care provider approves bathing and showering, cover the bandage or splint with a watertight plastic bag to protect it from water. Do not let the bandage or splint get wet. Managing pain, stiffness,  and swelling  If directed, apply ice to the injured area:  Put ice in a plastic bag.  Place a towel between your skin and the bag.  Leave the ice on for 20 minutes, 2-3 times per day.  Move your toes often to avoid stiffness and to lessen swelling.  Raise (elevate) the injured area above the level of your heart while you are sitting or lying down. Driving  Do not drive or operate heavy machinery while taking pain medicine.  Ask your health care provider when it is safe to drive if you have a  bandage, splint, or walking boot on your foot. Activity  Rest as directed by your health care provider.  Do not use the injured foot to support your body weight until your health care provider says that you can. Use crutches or other supportive devices as directed by your health care provider.  Ask your health care provider what activities are safe for you. Gradually increase how much and how far you walk until your health care provider says it is safe to return to full activity.  Do any exercise or physical therapy as directed by your health care provider. General instructions  If a splint was applied, do not put pressure on any part of it until it is fully hardened. This may take several hours.  Take medicines only as directed by your health care provider. These include over-the-counter medicines and prescription medicines.  Keep all follow-up visits as directed by your health care provider. This is important.  When you can walk without pain, wear supportive shoes that have stiff soles. Do not wear flip-flops, and do not walk barefoot. Contact a health care provider if:  Your pain is not controlled with medicine.  Your bruising or swelling gets worse or does not get better with treatment.  Your splint or walking boot is damaged. Get help right away if:  You develop severe numbness or tingling in your foot.  Your foot turns blue, white, or gray, and it feels cold. This information is not intended to replace advice given to you by your health care provider. Make sure you discuss any questions you have with your health care provider. Document Released: 02/11/2002 Document Revised: 01/28/2016 Document Reviewed: 06/25/2014  2017 Elsevier

## 2017-08-15 ENCOUNTER — Ambulatory Visit: Payer: Self-pay

## 2017-08-15 NOTE — Telephone Encounter (Signed)
Pt. called with c/o sore throat, chest congestion and fever.  Stated the symptoms started on Sunday, 12/9.  Reported has had fever as high as 100 degrees.  Stated has a nonproductive cough with intermittent wheezing.  Reported her throat is very sore; stated "it aches in the back of my throat."  Reported she just feels "crummy."  Per protocol appt. Scheduled within 24 hrs.  Care advice given per protocol.  Pt. Verb. Understanding.      Reason for Disposition . [1] SEVERE sore throat AND [2] present > 24 hours  Answer Assessment - Initial Assessment Questions 1. ONSET: "When did the nasal discharge start?"      Started Sunday, 12/9 2. AMOUNT: "How much discharge is there?"      Regular sneezing and blowing nose; thick white/ yellow  3. COUGH: "Do you have a cough?" If yes, ask: "Describe the color of your sputum" (clear, white, yellow, green)     Intermittent cough 4. RESPIRATORY DISTRESS: "Describe your breathing."      Denies shortness of breath  5. FEVER: "Do you have a fever?" If so, ask: "What is your temperature, how was it measured, and when did it start?"     10 0 degrees on 12/10 6. SEVERITY: "Overall, how bad are you feeling right now?" (e.g., doesn't interfere with normal activities, staying home from school/work, staying in bed)      Feels real crummy 7. OTHER SYMPTOMS: "Do you have any other symptoms?" (e.g., sore throat, earache, wheezing, vomiting)     C/o sore throat; hard to swallow; wheezing; denies earache or sinus pain; denies nausea/ vomiting  8. PREGNANCY: "Is there any chance you are pregnant?" "When was your last menstrual period?"     No; 3 weeks ago  Protocols used: COMMON COLD-A-AH

## 2017-08-16 ENCOUNTER — Ambulatory Visit (INDEPENDENT_AMBULATORY_CARE_PROVIDER_SITE_OTHER): Payer: Self-pay | Admitting: Physician Assistant

## 2017-08-16 ENCOUNTER — Encounter: Payer: Self-pay | Admitting: Physician Assistant

## 2017-08-16 VITALS — BP 130/82 | HR 69 | Temp 98.3°F | Resp 18 | Ht 66.0 in | Wt 198.0 lb

## 2017-08-16 DIAGNOSIS — R05 Cough: Secondary | ICD-10-CM

## 2017-08-16 DIAGNOSIS — R059 Cough, unspecified: Secondary | ICD-10-CM

## 2017-08-16 DIAGNOSIS — J069 Acute upper respiratory infection, unspecified: Secondary | ICD-10-CM

## 2017-08-16 LAB — POCT RAPID STREP A (OFFICE): RAPID STREP A SCREEN: NEGATIVE

## 2017-08-16 MED ORDER — GUAIFENESIN ER 1200 MG PO TB12: 1.0000 | ORAL_TABLET | Freq: Two times a day (BID) | ORAL | 1 refills | Status: DC | PRN

## 2017-08-16 NOTE — Progress Notes (Signed)
PRIMARY CARE AT [facility] [address], [place] [address] [phone]  Date:  08/16/2017   Name:  Amy Poole   DOB:  02-14-1971   MRN:  [id number]  PCP:  Florina Ou, MD (Inactive)    History of Present Illness:  Amy Poole is a 46 y.o. female patient who presents to PCP with  Chief Complaint  Patient presents with  . Cough    x3 days; clear to pale yellow sputum  . Sore Throat    x3 days  . Nasal Congestion    x3 days     3 days of sore throat, coughing productive yellow sputum.  Thick and yellow mucus when blowing her nose.  She is having headaches and bodyaches along her shoulder.  No ear discomfort.  Runny nose.  A little sinus pain along the maxillary position.  Subjective fever and chills.  She has some teeth aching of her upper region.  She has noted when some sob.  Mild sneezing.  Some water eyes.  Zyrtec daily she takes for her allergies which helps.  Zicam.   She has taken aleve which helps a little.  Patient Active Problem List   Diagnosis Date Noted  . Asthma, chronic 01/28/2015  . Rhinitis, allergic 01/28/2015    Past Medical History:  Diagnosis Date  . Bradycardia   . Chest pain   . Syncope and collapse     Past Surgical History:  Procedure Laterality Date  . APPENDECTOMY  07/1983  . DOBUTAMINE STRESS ECHO  04/08/2010   NORMAL    Social History   Tobacco Use  . Smoking status: Never Smoker  . Smokeless tobacco: Never Used  Substance Use Topics  . Alcohol use: Yes    Comment: 1-2 month  . Drug use: No    Family History  Problem Relation Age of Onset  . Hypertension Mother   . Diabetes Father   . Heart disease Father   . Cancer Paternal Grandmother        breast    Allergies  Allergen Reactions  . Codeine Nausea And Vomiting  . Penicillins Nausea And Vomiting    Medication list has been reviewed and updated.  Current Outpatient Medications on File Prior to Visit  Medication Sig Dispense Refill  . albuterol (PROVENTIL  HFA;VENTOLIN HFA) 108 (90 BASE) MCG/ACT inhaler Inhale 2 puffs into the lungs every 4 (four) hours as needed for wheezing or shortness of breath (cough, shortness of breath or wheezing.). 1 Inhaler 11  . cetirizine (ZYRTEC) 10 MG tablet Take 10 mg by mouth daily.    . SUMAtriptan (IMITREX) 100 MG tablet Take 100 mg by mouth every 2 (two) hours as needed for migraine. May repeat in 2 hours if headache persists or recurs.    . tizanidine (ZANAFLEX) 2 MG capsule Take 2 mg by mouth 3 (three) times daily.     No current facility-administered medications on file prior to visit.     ROS ROS otherwise unremarkable unless listed above.  Physical Examination: BP 130/82   Pulse 69   Temp 98.3 F (36.8 C) (Oral)   Resp 18   Ht 5\' 6"  (1.676 m)   Wt 198 lb (89.8 kg)   SpO2 99%   BMI 31.96 kg/m  Ideal Body Weight: Weight in (lb) to have BMI = 25: 154.6  Physical Exam  Constitutional: She is oriented to person, place, and time. She appears well-developed and well-nourished. No distress.  HENT:  Head: Normocephalic  and atraumatic.  Right Ear: Tympanic membrane, external ear and ear canal normal.  Left Ear: Tympanic membrane, external ear and ear canal normal.  Nose: Mucosal edema and rhinorrhea present. Right sinus exhibits no maxillary sinus tenderness and no frontal sinus tenderness. Left sinus exhibits no maxillary sinus tenderness and no frontal sinus tenderness.  Mouth/Throat: No uvula swelling. No oropharyngeal exudate, posterior oropharyngeal edema or posterior oropharyngeal erythema.  Eyes: Conjunctivae and EOM are normal. Pupils are equal, round, and reactive to light.  Cardiovascular: Normal rate and regular rhythm. Exam reveals no gallop, no distant heart sounds and no friction rub.  No murmur heard. Pulmonary/Chest: Effort normal. No respiratory distress. She has no decreased breath sounds. She has no wheezes. She has no rhonchi.  Lymphadenopathy:       Head (right side): No  submandibular, no tonsillar, no preauricular and no posterior auricular adenopathy present.       Head (left side): No submandibular, no tonsillar, no preauricular and no posterior auricular adenopathy present.  Neurological: She is alert and oriented to person, place, and time.  Skin: She is not diaphoretic.  Psychiatric: She has a normal mood and affect. Her behavior is normal.    Assessment and Plan: Amy Poole is a 46 y.o. female who is here today for cc of  Chief Complaint  Patient presents with  . Cough    x3 days; clear to pale yellow sputum  . Sore Throat    x3 days  . Nasal Congestion    x3 days  --appears viral.  Treating supportively.  Advised to follow up as needed. Acute upper respiratory infection - Plan: Guaifenesin (MUCINEX MAXIMUM STRENGTH) 1200 MG TB12  Cough - Plan: POCT rapid strep A, Culture, Group A Strep  Ivar Drape, PA-C [facility] 12/15/20188:07 PM

## 2017-08-16 NOTE — Patient Instructions (Addendum)
Please hydrate well with 64 oz of water if not more.  Please take tylenol or ibuprofen for pain and fever.   If your symptoms are not improving, please contact me.  Please use your albuterol if you are having trouble with your breathing. The virus can exacerbate your asthma.     Upper Respiratory Infection, Adult Most upper respiratory infections (URIs) are caused by a virus. A URI affects the nose, throat, and upper air passages. The most common type of URI is often called "the common cold." Follow these instructions at home:  Take medicines only as told by your doctor.  Gargle warm saltwater or take cough drops to comfort your throat as told by your doctor.  Use a warm mist humidifier or inhale steam from a shower to increase air moisture. This may make it easier to breathe.  Drink enough fluid to keep your pee (urine) clear or pale yellow.  Eat soups and other clear broths.  Have a healthy diet.  Rest as needed.  Go back to work when your fever is gone or your doctor says it is okay. ? You may need to stay home longer to avoid giving your URI to others. ? You can also wear a face mask and wash your hands often to prevent spread of the virus.  Use your inhaler more if you have asthma.  Do not use any tobacco products, including cigarettes, chewing tobacco, or electronic cigarettes. If you need help quitting, ask your doctor. Contact a doctor if:  You are getting worse, not better.  Your symptoms are not helped by medicine.  You have chills.  You are getting more short of breath.  You have brown or red mucus.  You have yellow or brown discharge from your nose.  You have pain in your face, especially when you bend forward.  You have a fever.  You have puffy (swollen) neck glands.  You have pain while swallowing.  You have white areas in the back of your throat. Get help right away if:  You have very bad or constant: ? Headache. ? Ear pain. ? Pain in your  forehead, behind your eyes, and over your cheekbones (sinus pain). ? Chest pain.  You have long-lasting (chronic) lung disease and any of the following: ? Wheezing. ? Long-lasting cough. ? Coughing up blood. ? A change in your usual mucus.  You have a stiff neck.  You have changes in your: ? Vision. ? Hearing. ? Thinking. ? Mood. This information is not intended to replace advice given to you by your health care provider. Make sure you discuss any questions you have with your health care provider. Document Released: 02/08/2008 Document Revised: 04/24/2016 Document Reviewed: 11/27/2013 Elsevier Interactive Patient Education  2018 [employer].     IF you received an x-ray today, you will receive an invoice from [facility]. Please contact [facility] at [phone] with questions or concerns regarding your invoice.   IF you received labwork today, you will receive an invoice from [facility]. Please contact [facility] at [phone] with questions or concerns regarding your invoice.   Our billing staff will not be able to assist you with questions regarding bills from these companies.  You will be contacted with the lab results as soon as they are available. The fastest way to get your results is to activate your My Chart account. Instructions are located on the last page of this paperwork. If you have not heard from [place] regarding the results in  2 weeks, please contact this office.

## 2017-08-18 LAB — CULTURE, GROUP A STREP: Strep A Culture: NEGATIVE

## 2017-09-06 DIAGNOSIS — M545 Low back pain: Secondary | ICD-10-CM | POA: Diagnosis not present

## 2017-09-14 DIAGNOSIS — R1084 Generalized abdominal pain: Secondary | ICD-10-CM | POA: Diagnosis not present

## 2017-09-14 DIAGNOSIS — E669 Obesity, unspecified: Secondary | ICD-10-CM | POA: Diagnosis not present

## 2017-09-14 DIAGNOSIS — N911 Secondary amenorrhea: Secondary | ICD-10-CM | POA: Diagnosis not present

## 2017-09-14 DIAGNOSIS — Z131 Encounter for screening for diabetes mellitus: Secondary | ICD-10-CM | POA: Diagnosis not present

## 2017-09-14 IMAGING — DX DG FOOT COMPLETE 3+V*L*
3 series · 3 of 3 positions shown · non-contrast
Comparison: None.

CLINICAL DATA: Pain following injury several days prior

EXAM:
LEFT FOOT - COMPLETE 3+ VIEW

[foot ap]
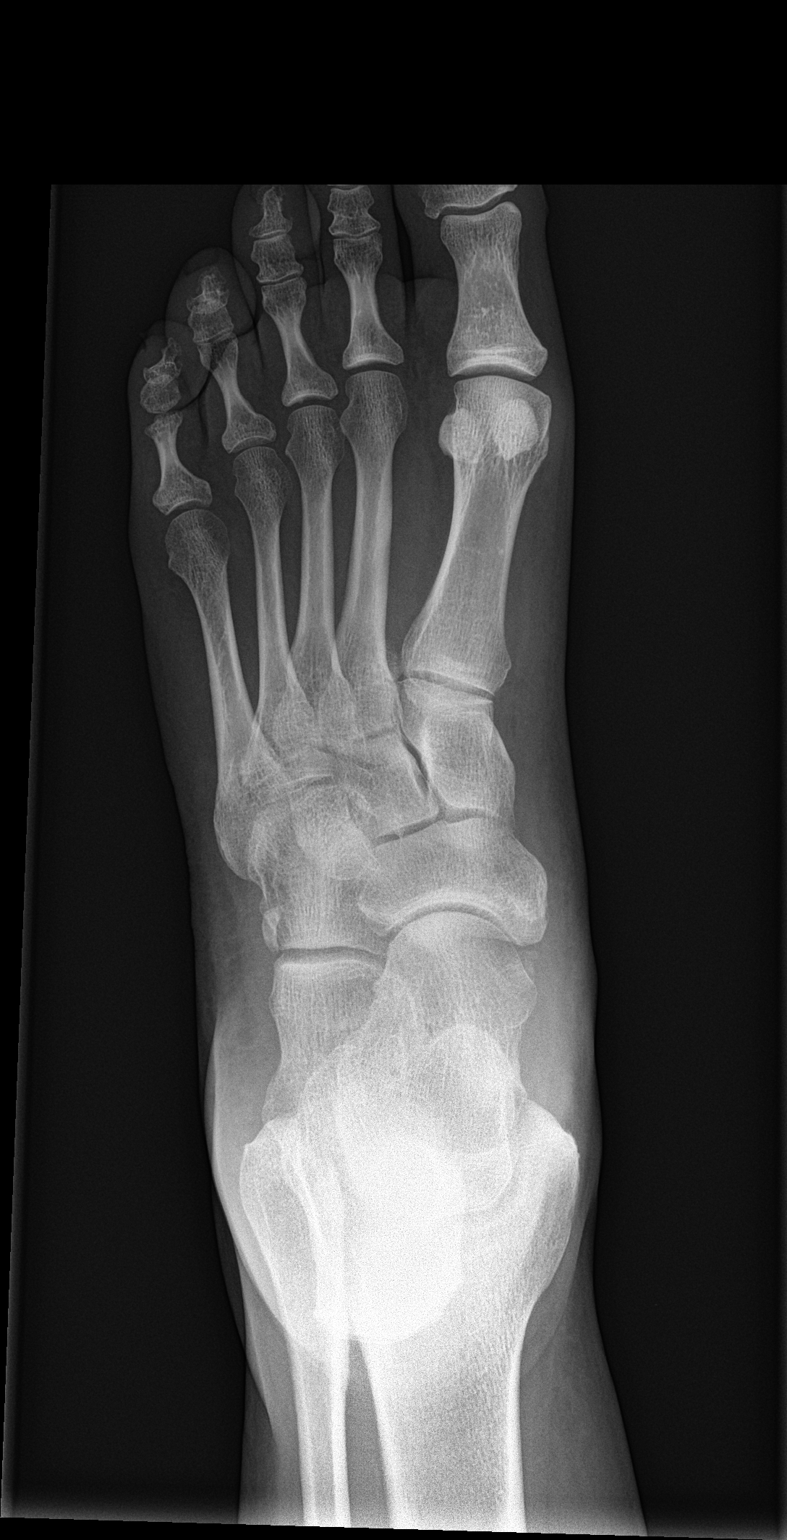

[foot obl]
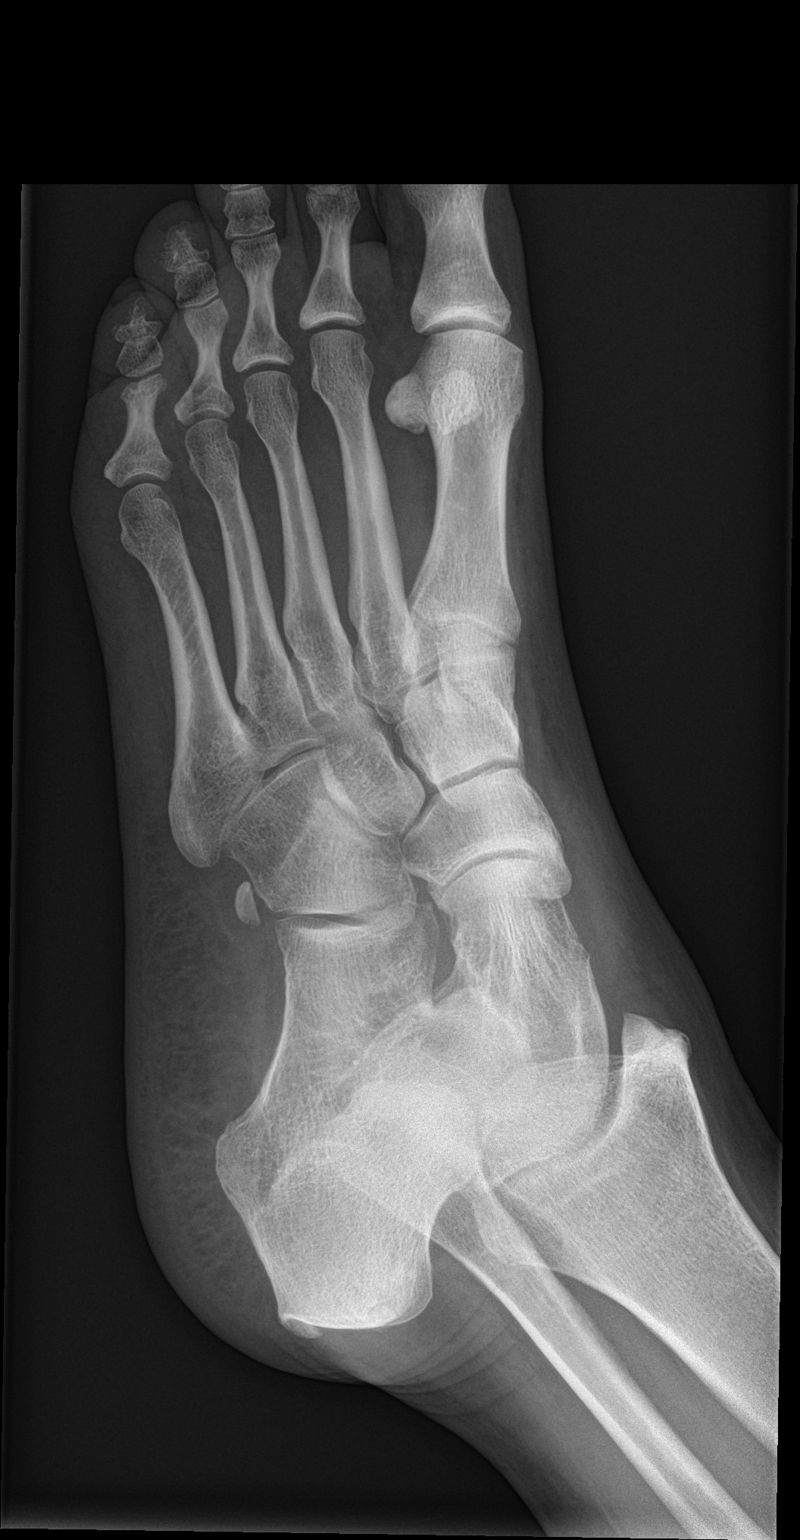

[foot lat]
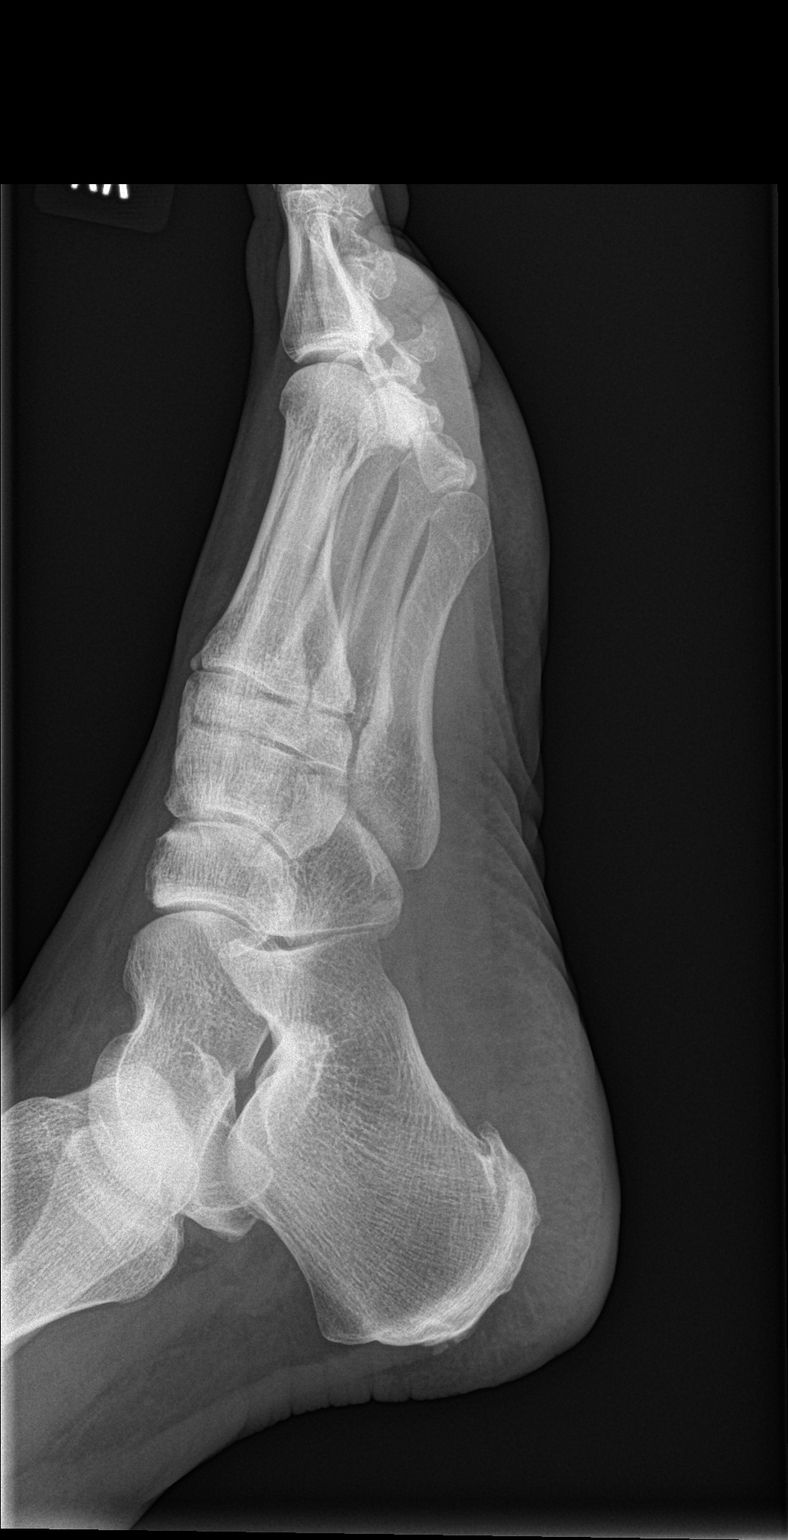

[3 of 3 positions shown; findings below may reference images not displayed]

FINDINGS: Frontal, oblique, and lateral views were obtained. There is no
fracture or dislocation. Joint spaces appear normal. No erosive
change. There is an accessory ossicle lateral to the cuboid, an
anatomic variant. There are small posterior and inferior calcaneal
spurs.
IMPRESSION: Small calcaneal spurs. No fracture or dislocation. No evident
arthropathy.

## 2017-09-20 DIAGNOSIS — M545 Low back pain: Secondary | ICD-10-CM | POA: Diagnosis not present

## 2017-10-12 DIAGNOSIS — E669 Obesity, unspecified: Secondary | ICD-10-CM | POA: Diagnosis not present

## 2017-10-12 DIAGNOSIS — Z6831 Body mass index (BMI) 31.0-31.9, adult: Secondary | ICD-10-CM | POA: Diagnosis not present

## 2017-10-12 DIAGNOSIS — R7989 Other specified abnormal findings of blood chemistry: Secondary | ICD-10-CM | POA: Diagnosis not present

## 2017-11-08 DIAGNOSIS — H538 Other visual disturbances: Secondary | ICD-10-CM | POA: Diagnosis not present

## 2017-11-08 DIAGNOSIS — H04123 Dry eye syndrome of bilateral lacrimal glands: Secondary | ICD-10-CM | POA: Diagnosis not present

## 2017-11-08 DIAGNOSIS — H40013 Open angle with borderline findings, low risk, bilateral: Secondary | ICD-10-CM | POA: Diagnosis not present

## 2017-11-08 DIAGNOSIS — H25013 Cortical age-related cataract, bilateral: Secondary | ICD-10-CM | POA: Diagnosis not present

## 2017-11-24 DIAGNOSIS — Z6828 Body mass index (BMI) 28.0-28.9, adult: Secondary | ICD-10-CM | POA: Diagnosis not present

## 2017-11-24 DIAGNOSIS — E663 Overweight: Secondary | ICD-10-CM | POA: Diagnosis not present

## 2017-11-30 DIAGNOSIS — J209 Acute bronchitis, unspecified: Secondary | ICD-10-CM | POA: Diagnosis not present

## 2017-12-05 ENCOUNTER — Encounter: Payer: Self-pay | Admitting: Physician Assistant

## 2017-12-15 DIAGNOSIS — Z1231 Encounter for screening mammogram for malignant neoplasm of breast: Secondary | ICD-10-CM | POA: Diagnosis not present

## 2017-12-29 ENCOUNTER — Other Ambulatory Visit: Payer: Self-pay | Admitting: Obstetrics and Gynecology

## 2017-12-29 DIAGNOSIS — R928 Other abnormal and inconclusive findings on diagnostic imaging of breast: Secondary | ICD-10-CM

## 2018-01-05 ENCOUNTER — Other Ambulatory Visit: Payer: Self-pay | Admitting: Obstetrics and Gynecology

## 2018-01-05 ENCOUNTER — Ambulatory Visit
Admission: RE | Admit: 2018-01-05 | Discharge: 2018-01-05 | Disposition: A | Discharge status: Home / Self Care | Payer: PRIVATE HEALTH INSURANCE | Source: Ambulatory Visit | Attending: Obstetrics and Gynecology | Admitting: Obstetrics and Gynecology

## 2018-01-05 ENCOUNTER — Ambulatory Visit
Admission: RE | Admit: 2018-01-05 | Discharge: 2018-01-05 | Disposition: A | Discharge status: Home / Self Care | Payer: BLUE CROSS/BLUE SHIELD | Source: Ambulatory Visit | Attending: Obstetrics and Gynecology | Admitting: Obstetrics and Gynecology

## 2018-01-05 DIAGNOSIS — Z09 Encounter for follow-up examination after completed treatment for conditions other than malignant neoplasm: Secondary | ICD-10-CM

## 2018-01-05 DIAGNOSIS — R928 Other abnormal and inconclusive findings on diagnostic imaging of breast: Secondary | ICD-10-CM

## 2018-01-05 DIAGNOSIS — N6001 Solitary cyst of right breast: Secondary | ICD-10-CM | POA: Diagnosis not present

## 2018-01-05 DIAGNOSIS — R922 Inconclusive mammogram: Secondary | ICD-10-CM | POA: Diagnosis not present

## 2018-01-09 ENCOUNTER — Other Ambulatory Visit: Payer: Self-pay | Admitting: Obstetrics and Gynecology

## 2018-01-09 DIAGNOSIS — N63 Unspecified lump in unspecified breast: Secondary | ICD-10-CM

## 2018-03-15 ENCOUNTER — Encounter: Payer: Self-pay | Admitting: Urgent Care

## 2018-03-15 ENCOUNTER — Ambulatory Visit: Payer: BLUE CROSS/BLUE SHIELD | Admitting: Urgent Care

## 2018-03-15 VITALS — BP 112/71 | HR 60 | Temp 98.1°F | Resp 16 | Ht 66.0 in | Wt 153.8 lb

## 2018-03-15 DIAGNOSIS — K644 Residual hemorrhoidal skin tags: Secondary | ICD-10-CM

## 2018-03-15 DIAGNOSIS — K6289 Other specified diseases of anus and rectum: Secondary | ICD-10-CM | POA: Diagnosis not present

## 2018-03-15 MED ORDER — LIDOCAINE (ANORECTAL) 5 % EX CREA
1.0000 | TOPICAL_CREAM | Freq: Two times a day (BID) | CUTANEOUS | 0 refills | Status: DC
Start: 2018-03-15 — End: 2021-09-15

## 2018-03-15 NOTE — Patient Instructions (Addendum)
Our staff will try to schedule a consult with general surgery asap. In the meantime, use lidocaine gel for your pain, over-the-counter Colace (docusate) 50mg  1-2 times daily. Hydrate well with at least 2 liters (1 gallon) of water daily. Eat soups, soft fiber.    Hemorrhoids Hemorrhoids are swollen veins in and around the rectum or anus. There are two types of hemorrhoids:  Internal hemorrhoids. These occur in the veins that are just inside the rectum. They may poke through to the outside and become irritated and painful.  External hemorrhoids. These occur in the veins that are outside of the anus and can be felt as a painful swelling or hard lump near the anus.  Most hemorrhoids do not cause serious problems, and they can be managed with home treatments such as diet and lifestyle changes. If home treatments do not help your symptoms, procedures can be done to shrink or remove the hemorrhoids. What are the causes? This condition is caused by increased pressure in the anal area. This pressure may result from various things, including:  Constipation.  Straining to have a bowel movement.  Diarrhea.  Pregnancy.  Obesity.  Sitting for long periods of time.  Heavy lifting or other activity that causes you to strain.  Anal sex.  What are the signs or symptoms? Symptoms of this condition include:  Pain.  Anal itching or irritation.  Rectal bleeding.  Leakage of stool (feces).  Anal swelling.  One or more lumps around the anus.  How is this diagnosed? This condition can often be diagnosed through a visual exam. Other exams or tests may also be done, such as:  Examination of the rectal area with a gloved hand (digital rectal exam).  Examination of the anal canal using a small tube (anoscope).  A blood test, if you have lost a significant amount of blood.  A test to look inside the colon (sigmoidoscopy or colonoscopy).  How is this treated? This condition can usually be  treated at home. However, various procedures may be done if dietary changes, lifestyle changes, and other home treatments do not help your symptoms. These procedures can help make the hemorrhoids smaller or remove them completely. Some of these procedures involve surgery, and others do not. Common procedures include:  Rubber band ligation. Rubber bands are placed at the base of the hemorrhoids to cut off the blood supply to them.  Sclerotherapy. Medicine is injected into the hemorrhoids to shrink them.  Infrared coagulation. A type of light energy is used to get rid of the hemorrhoids.  Hemorrhoidectomy surgery. The hemorrhoids are surgically removed, and the veins that supply them are tied off.  Stapled hemorrhoidopexy surgery. A circular stapling device is used to remove the hemorrhoids and use staples to cut off the blood supply to them.  Follow these instructions at home: Eating and drinking  Eat foods that have a lot of fiber in them, such as whole grains, beans, nuts, fruits, and vegetables. Ask your health care provider about taking products that have added fiber (fiber supplements).  Drink enough fluid to keep your urine clear or pale yellow. Managing pain and swelling  Take warm sitz baths for 20 minutes, 3-4 times a day to ease pain and discomfort.  If directed, apply ice to the affected area. Using ice packs between sitz baths may be helpful. ? Put ice in a plastic bag. ? Place a towel between your skin and the bag. ? Leave the ice on for 20 minutes, 2-3 times  a day. General instructions  Take over-the-counter and prescription medicines only as told by your health care provider.  Use medicated creams or suppositories as told.  Exercise regularly.  Go to the bathroom when you have the urge to have a bowel movement. Do not wait.  Avoid straining to have bowel movements.  Keep the anal area dry and clean. Use wet toilet paper or moist towelettes after a bowel  movement.  Do not sit on the toilet for long periods of time. This increases blood pooling and pain. Contact a health care provider if:  You have increasing pain and swelling that are not controlled by treatment or medicine.  You have uncontrolled bleeding.  You have difficulty having a bowel movement, or you are unable to have a bowel movement.  You have pain or inflammation outside the area of the hemorrhoids. This information is not intended to replace advice given to you by your health care provider. Make sure you discuss any questions you have with your health care provider. Document Released: 08/19/2000 Document Revised: 01/20/2016 Document Reviewed: 05/06/2015 Elsevier Interactive Patient Education  2018 [employer].     IF you received an x-ray today, you will receive an invoice from [facility]. Please contact [facility] at [phone] with questions or concerns regarding your invoice.   IF you received labwork today, you will receive an invoice from [facility]. Please contact [facility] at [phone] with questions or concerns regarding your invoice.   Our billing staff will not be able to assist you with questions regarding bills from these companies.  You will be contacted with the lab results as soon as they are available. The fastest way to get your results is to activate your My Chart account. Instructions are located on the last page of this paperwork. If you have not heard from [place] regarding the results in 2 weeks, please contact this office.

## 2018-03-15 NOTE — Progress Notes (Signed)
    MRN: [id number] DOB: 06-06-1971  Subjective:   Amy Poole is a 47 y.o. female presenting for long standing history of external hemorrhoids since she was 47 y/o. Has had 3-4 days of severe anal-rectal pain. Denies frank blood in stools. Has never had excision of her external thrombosed hemorrhoids. Has tried preparation H. Denies difficulty with constipation. Eats plenty of fiber and hydrates well. Denies heavy lifting.   Amy Poole has a current medication list which includes the following prescription(s): albuterol and sumatriptan. Also is allergic to codeine and penicillins.  Amy Poole  has a past medical history of Bradycardia, Chest pain, and Syncope and collapse. Also  has a past surgical history that includes Dobutamine stress echo (04/08/2010) and Appendectomy (07/1983).  Objective:   Vitals: BP 112/71   Pulse 60   Temp 98.1 F (36.7 C) (Oral)   Resp 16   Ht 5\' 6"  (1.676 m)   Wt 153 lb 12.8 oz (69.8 kg)   SpO2 97%   BMI 24.82 kg/m   Physical Exam  Constitutional: She is oriented to person, place, and time. She appears well-developed and well-nourished.  Cardiovascular: Normal rate.  Pulmonary/Chest: Effort normal.  Genitourinary:     Neurological: She is alert and oriented to person, place, and time.   Assessment and Plan :   External hemorrhoids - Plan: Ambulatory referral to General Surgery  Anal pain - Plan: Ambulatory referral to General Surgery  Will refer to [facility] for urgent consult. Supportive measures recommended in the meantime.   Jaynee Eagles, PA-C [facility] [phone] 03/15/2018  4:44 PM

## 2018-03-16 DIAGNOSIS — K645 Perianal venous thrombosis: Secondary | ICD-10-CM | POA: Diagnosis not present

## 2018-04-15 ENCOUNTER — Emergency Department (HOSPITAL_COMMUNITY): Payer: BLUE CROSS/BLUE SHIELD

## 2018-04-15 ENCOUNTER — Other Ambulatory Visit: Payer: Self-pay

## 2018-04-15 ENCOUNTER — Emergency Department (HOSPITAL_COMMUNITY)
Admission: EM | Admit: 2018-04-15 | Discharge: 2018-04-15 | Disposition: A | Discharge status: Home / Self Care | Payer: BLUE CROSS/BLUE SHIELD | Attending: Emergency Medicine | Admitting: Emergency Medicine

## 2018-04-15 ENCOUNTER — Encounter (HOSPITAL_COMMUNITY): Payer: Self-pay | Admitting: Emergency Medicine

## 2018-04-15 DIAGNOSIS — Z79899 Other long term (current) drug therapy: Secondary | ICD-10-CM | POA: Diagnosis not present

## 2018-04-15 DIAGNOSIS — R55 Syncope and collapse: Secondary | ICD-10-CM | POA: Insufficient documentation

## 2018-04-15 DIAGNOSIS — H8110 Benign paroxysmal vertigo, unspecified ear: Secondary | ICD-10-CM | POA: Diagnosis not present

## 2018-04-15 DIAGNOSIS — J45909 Unspecified asthma, uncomplicated: Secondary | ICD-10-CM | POA: Insufficient documentation

## 2018-04-15 DIAGNOSIS — R42 Dizziness and giddiness: Secondary | ICD-10-CM | POA: Diagnosis not present

## 2018-04-15 DIAGNOSIS — E860 Dehydration: Secondary | ICD-10-CM

## 2018-04-15 DIAGNOSIS — R5383 Other fatigue: Secondary | ICD-10-CM | POA: Diagnosis not present

## 2018-04-15 LAB — URINALYSIS, ROUTINE W REFLEX MICROSCOPIC
BILIRUBIN URINE: NEGATIVE
GLUCOSE, UA: NEGATIVE mg/dL
Hgb urine dipstick: NEGATIVE
Ketones, ur: NEGATIVE mg/dL
Leukocytes, UA: NEGATIVE
Nitrite: NEGATIVE
PH: 8 (ref 5.0–8.0)
Protein, ur: NEGATIVE mg/dL
SPECIFIC GRAVITY, URINE: 1.016 (ref 1.005–1.030)

## 2018-04-15 LAB — BASIC METABOLIC PANEL
Anion gap: 7 (ref 5–15)
BUN: 12 mg/dL (ref 6–20)
CHLORIDE: 107 mmol/L (ref 98–111)
CO2: 25 mmol/L (ref 22–32)
Calcium: 9.4 mg/dL (ref 8.9–10.3)
Creatinine, Ser: 0.58 mg/dL (ref 0.44–1.00)
GFR calc Af Amer: >60 mL/min (ref >60)
GFR calc non Af Amer: >60 mL/min (ref >60)
GLUCOSE: 91 mg/dL (ref 70–99)
POTASSIUM: 4.3 mmol/L (ref 3.5–5.1)
Sodium: 139 mmol/L (ref 135–145)

## 2018-04-15 LAB — TSH: TSH: 0.835 u[IU]/mL (ref 0.350–4.500)

## 2018-04-15 LAB — CBC
HEMATOCRIT: 42.5 % (ref 36.0–46.0)
HEMOGLOBIN: 13.6 g/dL (ref 12.0–15.0)
MCH: 29.2 pg (ref 26.0–34.0)
MCHC: 32 g/dL (ref 30.0–36.0)
MCV: 91.2 fL (ref 78.0–100.0)
Platelets: 239 10*3/uL (ref 150–400)
RBC: 4.66 MIL/uL (ref 3.87–5.11)
RDW: 12.5 % (ref 11.5–15.5)
WBC: 8.9 10*3/uL (ref 4.0–10.5)

## 2018-04-15 LAB — I-STAT BETA HCG BLOOD, ED (MC, WL, AP ONLY): I-stat hCG, quantitative: <5 m[IU]/mL (ref <5)

## 2018-04-15 MED ORDER — SODIUM CHLORIDE 0.9 % IV SOLN: INTRAVENOUS | Status: DC

## 2018-04-15 MED ORDER — DIPHENHYDRAMINE HCL 50 MG/ML IJ SOLN
25.0000 mg | Freq: Once | INTRAMUSCULAR | Status: AC
  Administered 2018-04-15: 25 mg via INTRAVENOUS
  Filled 2018-04-15: qty 1

## 2018-04-15 MED ORDER — SODIUM CHLORIDE 0.9 % IV BOLUS
1000.0000 mL | Freq: Once | INTRAVENOUS | Status: AC
  Administered 2018-04-15: 1000 mL via INTRAVENOUS

## 2018-04-15 MED ORDER — METOCLOPRAMIDE HCL 5 MG/ML IJ SOLN
10.0000 mg | Freq: Once | INTRAMUSCULAR | Status: AC
  Administered 2018-04-15: 10 mg via INTRAVENOUS
  Filled 2018-04-15: qty 2

## 2018-04-15 NOTE — ED Triage Notes (Signed)
Pt reports dizziness x 1 week. Pt reports light-headedness, headache, and feeling fatigued. Pt reports having 3 syncopal events. Pt denies hitting her head, reports she was able to catch herself each time.

## 2018-04-15 NOTE — ED Notes (Signed)
Patient transported to X-ray 

## 2018-04-15 NOTE — ED Notes (Signed)
Returned from CT, walked to bathroom.

## 2018-04-15 NOTE — ED Provider Notes (Signed)
[facility] EMERGENCY DEPARTMENT Provider Note   CSN: [id number] Arrival date & time: 04/15/18  1605     History   Chief Complaint Chief Complaint  Patient presents with  . Dizziness  . Loss of Consciousness    HPI Amy Poole is a 48 y.o. female.  Pt presents to the ED today with dizziness and syncope.  Pt said she has been on vacation this week taking it easy.  She passed out on Wednesday, August 7.  She has had 2 more episodes this week where she almost passed out.  She denies hitting her head, but does c/o headache.  She did have a hemorrhoid removed on 7/11 and bled a lot from that.  She c/o fatigue and sleepiness.     Past Medical History:  Diagnosis Date  . Bradycardia   . Chest pain   . Syncope and collapse     Patient Active Problem List   Diagnosis Date Noted  . External hemorrhoids 03/15/2018  . Anal pain 03/15/2018  . Asthma, chronic 01/28/2015  . Rhinitis, allergic 01/28/2015    Past Surgical History:  Procedure Laterality Date  . APPENDECTOMY  07/1983  . DOBUTAMINE STRESS ECHO  04/08/2010   NORMAL     OB History   None      Home Medications    Prior to Admission medications   Medication Sig Start Date End Date Taking? Authorizing Provider  albuterol (PROVENTIL HFA;VENTOLIN HFA) 108 (90 BASE) MCG/ACT inhaler Inhale 2 puffs into the lungs every 4 (four) hours as needed for wheezing or shortness of breath (cough, shortness of breath or wheezing.). 01/28/15   Ezekiel Slocumb, PA-C  Lidocaine, Anorectal, 5 % CREA Apply 1 application topically 2 (two) times daily. 03/15/18   Jaynee Eagles, PA-C  SUMAtriptan (IMITREX) 100 MG tablet Take 100 mg by mouth every 2 (two) hours as needed for migraine. May repeat in 2 hours if headache persists or recurs.    [provider]    Family History Family History  Problem Relation Age of Onset  . Hypertension Mother   . Diabetes Father   . Heart disease Father   . Cancer Paternal  Grandmother        breast  . Breast cancer Paternal Grandmother     Social History Social History   Tobacco Use  . Smoking status: Never Smoker  . Smokeless tobacco: Never Used  Substance Use Topics  . Alcohol use: Yes    Comment: 1-2 month  . Drug use: No     Allergies   Codeine and Penicillins   Review of Systems Review of Systems  Constitutional: Positive for fatigue.  Neurological: Positive for dizziness and syncope.     Physical Exam Updated Vital Signs BP 111/69   Pulse 61   Temp 98.1 F (36.7 C) (Oral)   Resp 16   Ht 5\' 4"  (1.626 m)   Wt 70.3 kg   LMP 04/01/2018   SpO2 100%   BMI 26.61 kg/m   Physical Exam  Constitutional: She is oriented to person, place, and time. She appears well-developed and well-nourished.  HENT:  Head: Normocephalic and atraumatic.  Right Ear: External ear normal.  Left Ear: External ear normal.  Nose: Nose normal.  Mouth/Throat: Oropharynx is clear and moist.  Eyes: Pupils are equal, round, and reactive to light. Conjunctivae and EOM are normal.  Neck: Normal range of motion. Neck supple.  Cardiovascular: Normal rate, regular rhythm, normal heart sounds and  intact distal pulses.  Pulmonary/Chest: Effort normal and breath sounds normal.  Abdominal: Soft. Bowel sounds are normal.  Musculoskeletal: Normal range of motion.  Neurological: She is alert and oriented to person, place, and time.  Skin: Skin is warm. Capillary refill takes less than 2 seconds.  Psychiatric: She has a normal mood and affect. Her behavior is normal. Judgment and thought content normal.  Nursing note and vitals reviewed.    ED Treatments / Results  Labs (all labs ordered are listed, but only abnormal results are displayed) Labs Reviewed  BASIC METABOLIC PANEL  CBC  URINALYSIS, ROUTINE W REFLEX MICROSCOPIC  TSH  CBG MONITORING, ED  I-STAT BETA HCG BLOOD, ED ([facility], [facility], [facility] ONLY)    EKG EKG Interpretation  Date/Time:  Sunday April 15 2018  16:15:07 EDT Ventricular Rate:  67 PR Interval:  136 QRS Duration: 78 QT Interval:  380 QTC Calculation: 401 R Axis:   78 Text Interpretation:  Normal sinus rhythm Low voltage QRS Borderline ECG No significant change since last tracing Confirmed by Isla Pence [phone]) on 04/15/2018 5:20:42 PM   Radiology Dg Chest 2 View  Result Date: 04/15/2018 CLINICAL DATA:  Dizziness for a week. EXAM: CHEST - 2 VIEW COMPARISON:  April 16, 2010 FINDINGS: The heart size and mediastinal contours are within normal limits. Both lungs are clear. The visualized skeletal structures are unremarkable. IMPRESSION: No active cardiopulmonary disease. Electronically Signed   By: Dorise Bullion III M.D   On: 04/15/2018 18:07   Ct Head Wo Contrast  Result Date: 04/15/2018 CLINICAL DATA:  Dizziness for 1 week EXAM: CT HEAD WITHOUT CONTRAST TECHNIQUE: Contiguous axial images were obtained from the base of the skull through the vertex without intravenous contrast. COMPARISON:  11/28/2013 FINDINGS: Brain: Stable CSF density is noted in the midline posteriorly in the posterior fossa consistent with arachnoid cyst. It is unchanged from the prior MRI. No findings to suggest acute hemorrhage, acute infarction or space-occupying mass lesion are noted. Vascular: No hyperdense vessel or unexpected calcification. Skull: Normal. Negative for fracture or focal lesion. Sinuses/Orbits: No acute finding. Other: None. IMPRESSION: No acute intracranial abnormality noted. Electronically Signed   By: Inez Catalina M.D.   On: 04/15/2018 20:18    Procedures Procedures (including critical care time)  Medications Ordered in ED Medications  sodium chloride 0.9 % bolus 1,000 mL (0 mLs Intravenous Stopped 04/15/18 2014)    And  0.9 %  sodium chloride infusion (has no administration in time range)  metoCLOPramide (REGLAN) injection 10 mg (10 mg Intravenous Given 04/15/18 1854)  diphenhydrAMINE (BENADRYL) injection 25 mg (25 mg Intravenous  Given 04/15/18 1854)     Initial Impression / Assessment and Plan / ED Course  I have reviewed the triage vital signs and the nursing notes.  Pertinent labs & imaging results that were available during my care of the patient were reviewed by me and considered in my medical decision making (see chart for details).    Etiology for syncope unclear.  Pt is feeling better after fluids and reglan/benadryl.  She is instructed to drink plenty of fluids.  F/u with cardiology if sx persist.  Final Clinical Impressions(s) / ED Diagnoses   Final diagnoses:  Dehydration  Syncope, unspecified syncope type    ED Discharge Orders    None       Isla Pence, MD 04/15/18 2050

## 2018-04-23 DIAGNOSIS — M542 Cervicalgia: Secondary | ICD-10-CM | POA: Diagnosis not present

## 2018-04-23 DIAGNOSIS — M25512 Pain in left shoulder: Secondary | ICD-10-CM | POA: Diagnosis not present

## 2018-04-25 DIAGNOSIS — M25522 Pain in left elbow: Secondary | ICD-10-CM | POA: Diagnosis not present

## 2018-05-09 DIAGNOSIS — M25522 Pain in left elbow: Secondary | ICD-10-CM | POA: Diagnosis not present

## 2018-06-08 DIAGNOSIS — Z01419 Encounter for gynecological examination (general) (routine) without abnormal findings: Secondary | ICD-10-CM | POA: Diagnosis not present

## 2018-06-08 DIAGNOSIS — Z6826 Body mass index (BMI) 26.0-26.9, adult: Secondary | ICD-10-CM | POA: Diagnosis not present

## 2018-06-08 DIAGNOSIS — Z13220 Encounter for screening for lipoid disorders: Secondary | ICD-10-CM | POA: Diagnosis not present

## 2018-06-21 ENCOUNTER — Encounter: Payer: Self-pay | Admitting: Physician Assistant

## 2018-06-21 ENCOUNTER — Ambulatory Visit: Payer: BLUE CROSS/BLUE SHIELD | Admitting: Physician Assistant

## 2018-06-21 ENCOUNTER — Encounter: Payer: Self-pay | Admitting: Neurology

## 2018-06-21 VITALS — BP 102/70 | HR 60 | Temp 98.1°F | Resp 16 | Ht 65.0 in | Wt 158.0 lb

## 2018-06-21 DIAGNOSIS — R519 Headache, unspecified: Secondary | ICD-10-CM

## 2018-06-21 DIAGNOSIS — Z87898 Personal history of other specified conditions: Secondary | ICD-10-CM | POA: Diagnosis not present

## 2018-06-21 DIAGNOSIS — Z23 Encounter for immunization: Secondary | ICD-10-CM | POA: Diagnosis not present

## 2018-06-21 DIAGNOSIS — R51 Headache: Secondary | ICD-10-CM | POA: Diagnosis not present

## 2018-06-21 MED ORDER — RIZATRIPTAN BENZOATE 10 MG PO TABS: 10.0000 mg | ORAL_TABLET | ORAL | 1 refills | Status: DC | PRN

## 2018-06-21 NOTE — Progress Notes (Signed)
Amy Poole  MRN: [id number] DOB: 11-18-1970  PCP: Patient, No Pcp Per  Subjective:  Pt is a 47 year old female who presents to clinic for headaches.  HA have been off and on since August, but have now become an everyday occurrence.   Some are located in the back of her head "and feels like a soda can being crushed".  Others are on the side of her head, mostly on the right side. Feels like throbbing.   Others are in the front on one side.  Typically HA last about 4-5 hours. Recently episode of HA was about 8 days.  Migraines about 3-4 days/week.  Sometimes gets blurry vision - lasts about 1-2 minutes. Happened recently.  A few episodes of severe nausea.  Some mornings she wakes up with HA, "those are the worst". Happens once/week. Is unsure if she snores - does not have bed partner.  She is taking Imitrex "It's beating it back, but not making it go away". She has tried Excedrin Migraine in the past.  She works through the pain. "If I don't work I don't get paid" She drinks about 10 glasses/water daily.  Never been to HA clinic.  Father h/o OSA.   The headaches do not seem to be related to any time of the day. The patient rates her most severe headaches a 8 on a scale from 1 to 10. Recently, the headaches have been increasing in both severity and frequency. Precipitating factors include: none which have been determined. The headaches are usually not preceded by an aura. Associated neurologic symptoms: vision problems and "horrible nausea". The patient denies dizziness, loss of balance, muscle weakness, numbness of extremities and speech difficulties.  Other history includes: migraine headaches diagnosed in the past and allergic rhinitis. She takes Zyrtec every day.  Family history includes no known family members with significant headaches.  Diagnosed with migraines about 20 years ago.   She was seen in the ED 04/15/2018 for syncope of unknown etiology. Pt improved after IV fluids  and reglan/benadryl. She was advised to f/u with cards if symptoms persisted.  She denies further syncopal episodes since that time.  Three syncopal episodes in August "it just happened all of a sudden": First episode she was walking.  Second episode she was standing and singing in church Third episode she was standing at the counter at a doctor's office.   Head CT on 04/15/2018 - IMPRESSION: No acute intracranial abnormality noted.  Review of Systems  Constitutional: Negative for diaphoresis and fatigue.  Eyes: Positive for visual disturbance. Negative for photophobia and pain.  Cardiovascular: Negative for chest pain and palpitations.  Gastrointestinal: Positive for nausea. Negative for vomiting.  Neurological: Positive for syncope and headaches. Negative for dizziness, weakness and numbness.    Patient Active Problem List   Diagnosis Date Noted  . External hemorrhoids 03/15/2018  . Anal pain 03/15/2018  . Asthma, chronic 01/28/2015  . Rhinitis, allergic 01/28/2015    Current Outpatient Medications on File Prior to Visit  Medication Sig Dispense Refill  . albuterol (PROVENTIL HFA;VENTOLIN HFA) 108 (90 BASE) MCG/ACT inhaler Inhale 2 puffs into the lungs every 4 (four) hours as needed for wheezing or shortness of breath (cough, shortness of breath or wheezing.). 1 Inhaler 11  . cetirizine (ZYRTEC) 10 MG tablet Take 10 mg by mouth daily.    . Sertraline HCl (ZOLOFT PO) Take by mouth.    . SUMAtriptan (IMITREX) 100 MG tablet Take 100 mg by mouth every  2 (two) hours as needed for migraine. May repeat in 2 hours if headache persists or recurs.    . Lidocaine, Anorectal, 5 % CREA Apply 1 application topically 2 (two) times daily. (Patient not taking: Reported on 06/21/2018) 45 g 0   No current facility-administered medications on file prior to visit.     Allergies  Allergen Reactions  . Codeine Nausea And Vomiting  . Penicillins Nausea And Vomiting     Objective:  BP 102/70 (BP  Location: Left Arm, Patient Position: Sitting, Cuff Size: Normal)   Pulse 60   Temp 98.1 F (36.7 C) (Oral)   Resp 16   Ht 5\' 5"  (1.651 m)   Wt 158 lb (71.7 kg)   LMP 05/08/2018   SpO2 100%   BMI 26.29 kg/m   Physical Exam  Constitutional: She is oriented to person, place, and time. She appears well-developed and well-nourished. She does not appear ill.  Eyes: Pupils are equal, round, and reactive to light. EOM are normal. Right eye exhibits no nystagmus. Left eye exhibits no nystagmus.  Neurological: She is alert and oriented to person, place, and time. She has normal strength. No sensory deficit.  Vitals reviewed.   Assessment and Plan :  1. Nonintractable headache, unspecified chronicity pattern, unspecified headache type - pt is a 47 year old female who c/o HA that are worsening in severity and increasing in frequency with occasional vision changes and "severe nausea" over the past few months. Head CT on 04/15/2018 was negative. Plan to get an MRA of her neck. She had three episodes of syncope in August of this year with a negative work-up in the ED. Plan to refer to cards for evaluation. RTC in 3-4 weeks to recheck.  - MR Angiogram Neck W Wo Contrast; Future - Ambulatory referral to Neurology  2. Frequent headaches - rizatriptan (MAXALT) 10 MG tablet; Take 1 tablet (10 mg total) by mouth as needed for migraine. May repeat in 2 hours if needed  Dispense: 10 tablet; Refill: 1  3. History of syncope - Ambulatory referral to Cardiology  4. Flu vaccine need     Mercer Pod, PA-C  [facility] 06/21/2018 9:23 AM  Please note: Portions of this report may have been transcribed using dragon voice recognition software. Every effort was made to ensure accuracy; however, inadvertent computerized transcription errors may be present.

## 2018-06-21 NOTE — Patient Instructions (Addendum)
You will receive a phone call to schedule an appointment for an MRI, with cardiology and for the headache clinic.  See below for medication details.  Come back and see me in 3-4 weeks.    Rizatriptan: Please take one tablet at the onset of your headache. If it does not improve the symptoms please take one additional tablet. Do not take more then 2 tablets in 24hrs. Do not take use more then 2 to 3 times in a week.  Preventative: Magnesium citrate 400mg  to 600mg  daily, riboflavin 400mg  daily, Coenzyme Q 10 100mg  three times daily  To prevent or relieve headaches, try the following:  Cool Compress. Lie down and place a cool compress on your head.   Avoid headache triggers. If certain foods or odors seem to have triggered your migraines in the past, avoid them. A headache diary might help you identify triggers.   Include physical activity in your daily routine. Try a daily walk or other moderate aerobic exercise.   Manage stress. Find healthy ways to cope with the stressors, such as delegating tasks on your to-do list.   Practice relaxation techniques. Try deep breathing, yoga, massage and visualization.   Eat regularly. Eating regularly scheduled meals and maintaining a healthy diet might help prevent headaches. Also, drink plenty of fluids.   Follow a regular sleep schedule. Sleep deprivation might contribute to headaches  Consider biofeedback. With this mind-body technique, you learn to control certain bodily functions - such as muscle tension, heart rate and blood pressure - to prevent headaches or reduce headache pain.      IF you received an x-ray today, you will receive an invoice from [facility] Rad[facility]. Please contact [facility]iology at [phone] with questions or concerns regarding your invoice.   IF you received labwork today, you will receive an invoice from [place]. Please contact [facility] at [phone] with questions or concerns regarding your invoice.   Our  billing staff will not be able to assist you with questions regarding bills from these companies.  You will be contacted with the lab results as soon as they are available. The fastest way to get your results is to activate your My Chart account. Instructions are located on the last page of this paperwork. If you have not heard from [place] regarding the results in 2 weeks, please contact this office.

## 2018-07-02 ENCOUNTER — Ambulatory Visit
Admission: RE | Admit: 2018-07-02 | Discharge: 2018-07-02 | Disposition: A | Discharge status: Home / Self Care | Payer: BLUE CROSS/BLUE SHIELD | Source: Ambulatory Visit | Attending: Physician Assistant | Admitting: Physician Assistant

## 2018-07-02 DIAGNOSIS — H538 Other visual disturbances: Secondary | ICD-10-CM | POA: Diagnosis not present

## 2018-07-02 DIAGNOSIS — R519 Headache, unspecified: Secondary | ICD-10-CM

## 2018-07-02 DIAGNOSIS — R51 Headache: Principal | ICD-10-CM

## 2018-07-02 MED ORDER — GADOBENATE DIMEGLUMINE 529 MG/ML IV SOLN
14.0000 mL | Freq: Once | INTRAVENOUS | Status: AC | PRN
  Administered 2018-07-02: 14 mL via INTRAVENOUS

## 2018-07-04 ENCOUNTER — Ambulatory Visit: Payer: Self-pay | Admitting: *Deleted

## 2018-07-04 NOTE — Telephone Encounter (Signed)
FYI

## 2018-07-04 NOTE — Telephone Encounter (Signed)
Pt called with a lump on her head right below her hairline. She thinks that it has increase in size.  She has a headace on the right side of her head. She has not hit her head. She has some mild nausea. Takes Aleve for the discomfort. She has a hx of migraines but this is different. Her pain #  Is usually 4 but can go up to 6. Appointment was scheduled for 11/06 but requested an earlier one. She was seen previously in the office for headaches. Will call 911 or have someone drive her, if her symptoms increase with nausea/vomiting, severe headache, blurred vision, sweating or weakness. Routing to PCP.  Reason for Disposition . [1] MODERATE headache (e.g., interferes with normal activities) AND [2] present > 24 hours AND [3] unexplained  (Exceptions: analgesics not tried, typical migraine, or headache part of viral illness)  Answer Assessment - Initial Assessment Questions 1. LOCATION: "Where does it hurt?"      Right side of head 2. ONSET: "When did the headache start?" (Minutes, hours or days)      In august  3. PATTERN: "Does the pain come and go, or has it been constant since it started?"     Constant but comes and goes 4. SEVERITY: "How bad is the pain?" and "What does it keep you from doing?"  (e.g., Scale 1-10; mild, moderate, or severe)   - MILD (1-3): doesn't interfere with normal activities    - MODERATE (4-7): interferes with normal activities or awakens from sleep    - SEVERE (8-10): excruciating pain, unable to do any normal activities        Pain # 4 5. RECURRENT SYMPTOM: "Have you ever had headaches before?" If so, ask: "When was the last time?" and "What happened that time?"      Yes before had headaches but these seem to be different now 6. CAUSE: "What do you think is causing the headache?"     Not use 7. MIGRAINE: "Have you been diagnosed with migraine headaches?" If so, ask: "Is this headache similar?"      yes 8. HEAD INJURY: "Has there been any recent injury to the  head?"      Not injured head 9. OTHER SYMPTOMS: "Do you have any other symptoms?" (fever, stiff neck, eye pain, sore throat, cold symptoms)     Pressure in her eyeballs 10. PREGNANCY: "Is there any chance you are pregnant?" "When was your last menstrual period?"       LMP in September  Protocols used: [facility]

## 2018-07-05 ENCOUNTER — Encounter: Payer: Self-pay | Admitting: Physician Assistant

## 2018-07-05 ENCOUNTER — Emergency Department (HOSPITAL_COMMUNITY): Payer: BLUE CROSS/BLUE SHIELD

## 2018-07-05 ENCOUNTER — Encounter (HOSPITAL_COMMUNITY): Payer: Self-pay | Admitting: Emergency Medicine

## 2018-07-05 ENCOUNTER — Other Ambulatory Visit: Payer: Self-pay

## 2018-07-05 ENCOUNTER — Ambulatory Visit (HOSPITAL_COMMUNITY): Payer: BLUE CROSS/BLUE SHIELD

## 2018-07-05 ENCOUNTER — Emergency Department (HOSPITAL_COMMUNITY)
Admission: EM | Admit: 2018-07-05 | Discharge: 2018-07-05 | Disposition: A | Discharge status: Home / Self Care | Payer: BLUE CROSS/BLUE SHIELD | Attending: Emergency Medicine | Admitting: Emergency Medicine

## 2018-07-05 ENCOUNTER — Ambulatory Visit: Payer: BLUE CROSS/BLUE SHIELD | Admitting: Physician Assistant

## 2018-07-05 VITALS — BP 108/68 | HR 65 | Temp 98.5°F | Resp 16 | Ht 64.0 in | Wt 156.8 lb

## 2018-07-05 DIAGNOSIS — R519 Headache, unspecified: Secondary | ICD-10-CM

## 2018-07-05 DIAGNOSIS — Z79899 Other long term (current) drug therapy: Secondary | ICD-10-CM | POA: Insufficient documentation

## 2018-07-05 DIAGNOSIS — R51 Headache: Secondary | ICD-10-CM | POA: Insufficient documentation

## 2018-07-05 LAB — CBC
HCT: 40.5 % (ref 36.0–46.0)
Hemoglobin: 12.6 g/dL (ref 12.0–15.0)
MCH: 28.1 pg (ref 26.0–34.0)
MCHC: 31.1 g/dL (ref 30.0–36.0)
MCV: 90.4 fL (ref 80.0–100.0)
NRBC: 0 % (ref 0.0–0.2)
Platelets: 231 10*3/uL (ref 150–400)
RBC: 4.48 MIL/uL (ref 3.87–5.11)
RDW: 12.4 % (ref 11.5–15.5)
WBC: 6.9 10*3/uL (ref 4.0–10.5)

## 2018-07-05 LAB — BASIC METABOLIC PANEL
Anion gap: 5 (ref 5–15)
BUN: 17 mg/dL (ref 6–20)
CALCIUM: 8.9 mg/dL (ref 8.9–10.3)
CO2: 26 mmol/L (ref 22–32)
CREATININE: 0.66 mg/dL (ref 0.44–1.00)
Chloride: 108 mmol/L (ref 98–111)
GFR calc non Af Amer: >60 mL/min (ref >60)
Glucose, Bld: 81 mg/dL (ref 70–99)
Potassium: 3.6 mmol/L (ref 3.5–5.1)
SODIUM: 139 mmol/L (ref 135–145)

## 2018-07-05 LAB — POCT URINALYSIS DIP (MANUAL ENTRY)
Bilirubin, UA: NEGATIVE
Blood, UA: NEGATIVE
Glucose, UA: NEGATIVE mg/dL
Ketones, POC UA: NEGATIVE mg/dL
NITRITE UA: NEGATIVE
PH UA: 5.5 (ref 5.0–8.0)
PROTEIN UA: NEGATIVE mg/dL
Spec Grav, UA: 1.01 (ref 1.010–1.025)
UROBILINOGEN UA: 0.2 U/dL

## 2018-07-05 LAB — I-STAT BETA HCG BLOOD, ED (MC, WL, AP ONLY): I-stat hCG, quantitative: <5 m[IU]/mL (ref <5)

## 2018-07-05 LAB — POCT URINE PREGNANCY: PREG TEST UR: NEGATIVE

## 2018-07-05 NOTE — ED Provider Notes (Signed)
[facility] EMERGENCY DEPARTMENT Provider Note   CSN: [id number] Arrival date & time: 07/05/18  1745     History   Chief Complaint Chief Complaint  Patient presents with  . Headache    HPI Amy Poole is a 47 y.o. female.  The history is provided by the patient and medical records. No language interpreter was used.  Headache       47 year old female with history of bradycardia presenting for evaluation of headache.  Patient mention recurrent daily headache ongoing since August.  She described headache as a crushing throbbing sensation primarily affecting the right side of her head with intermittent right eye blurry vision.  She also report intermittent bouts of near syncope for the past few days which concerns her.  Headache is lasting throughout the day, currently rates as 3 out of 10.  No associated fever, neck stiffness, diplopia, confusion, slurring of speech, focal numbness or weakness, or rash.  She denies any neck pain chest pain or trouble breathing.  Her PCP has prescribed Maxalt which she used on occasion with some relief.  She also tried over-the-counter medication for her headache.  She has known history of bradycardia which is not new.  She denies increasing stress.  She did reach out to her PCP in regard to headache and was recommended to come to ER for further evaluation and to get a head CT scan.  Past Medical History:  Diagnosis Date  . Bradycardia   . Chest pain   . Syncope and collapse     Patient Active Problem List   Diagnosis Date Noted  . External hemorrhoids 03/15/2018  . Anal pain 03/15/2018  . Asthma, chronic 01/28/2015  . Rhinitis, allergic 01/28/2015    Past Surgical History:  Procedure Laterality Date  . APPENDECTOMY  07/1983  . DOBUTAMINE STRESS ECHO  04/08/2010   NORMAL     OB History   None      Home Medications    Prior to Admission medications   Medication Sig Start Date End Date Taking? Authorizing  Provider  albuterol (PROVENTIL HFA;VENTOLIN HFA) 108 (90 BASE) MCG/ACT inhaler Inhale 2 puffs into the lungs every 4 (four) hours as needed for wheezing or shortness of breath (cough, shortness of breath or wheezing.). 01/28/15   Ezekiel Slocumb, PA-C  cetirizine (ZYRTEC) 10 MG tablet Take 10 mg by mouth daily.    [provider]  Lidocaine, Anorectal, 5 % CREA Apply 1 application topically 2 (two) times daily. 03/15/18   Jaynee Eagles, PA-C  rizatriptan (MAXALT) 10 MG tablet Take 1 tablet (10 mg total) by mouth as needed for migraine. May repeat in 2 hours if needed 06/21/18   McVey, Gelene Mink, PA-C  Sertraline HCl (ZOLOFT PO) Take by mouth.    [provider]  SUMAtriptan (IMITREX) 100 MG tablet Take 100 mg by mouth every 2 (two) hours as needed for migraine. May repeat in 2 hours if headache persists or recurs.    [provider]    Family History Family History  Problem Relation Age of Onset  . Hypertension Mother   . Diabetes Father   . Heart disease Father   . Cancer Paternal Grandmother        breast  . Breast cancer Paternal Grandmother     Social History Social History   Tobacco Use  . Smoking status: Never Smoker  . Smokeless tobacco: Never Used  Substance Use Topics  . Alcohol use: Yes  Comment: 1-2 month  . Drug use: No     Allergies   Codeine and Penicillins   Review of Systems Review of Systems  Neurological: Positive for headaches.  All other systems reviewed and are negative.    Physical Exam Updated Vital Signs BP 124/82   Pulse (!) 51   Temp 99.3 F (37.4 C) (Oral)   Resp 18   SpO2 100%   Physical Exam  Constitutional: She is oriented to person, place, and time. She appears well-developed and well-nourished. No distress.  HENT:  Head: Normocephalic and atraumatic.  Mouth/Throat: Oropharynx is clear and moist.  Eyes: Pupils are equal, round, and reactive to light. Conjunctivae and EOM are normal.  Neck: Normal  range of motion. Neck supple. No neck rigidity.  Cardiovascular: Normal rate and regular rhythm.  Pulmonary/Chest: Effort normal and breath sounds normal.  Musculoskeletal: Normal range of motion.  Neurological: She is alert and oriented to person, place, and time. She has normal strength. No cranial nerve deficit or sensory deficit. She displays a negative Romberg sign. GCS eye subscore is 4. GCS verbal subscore is 5. GCS motor subscore is 6.  Skin: No rash noted.  Psychiatric: She has a normal mood and affect.  Nursing note and vitals reviewed.    ED Treatments / Results  Labs (all labs ordered are listed, but only abnormal results are displayed) Labs Reviewed  BASIC METABOLIC PANEL  CBC  URINALYSIS, ROUTINE W REFLEX MICROSCOPIC  I-STAT BETA HCG BLOOD, ED ([facility], [facility], [facility] ONLY)    EKG None  Radiology Ct Head Wo Contrast  Result Date: 07/05/2018 CLINICAL DATA:  Thunderclap headache. EXAM: CT HEAD WITHOUT CONTRAST TECHNIQUE: Contiguous axial images were obtained from the base of the skull through the vertex without intravenous contrast. COMPARISON:  None. FINDINGS: Brain: There is no mass, hemorrhage or extra-axial collection. The size and configuration of the ventricles and extra-axial CSF spaces are normal. The brain parenchyma is normal, without evidence of acute or chronic infarction. Vascular: No abnormal hyperdensity of the major intracranial arteries or dural venous sinuses. No intracranial atherosclerosis. Skull: The visualized skull base, calvarium and extracranial soft tissues are normal. Sinuses/Orbits: No fluid levels or advanced mucosal thickening of the visualized paranasal sinuses. No mastoid or middle ear effusion. The orbits are normal. IMPRESSION: Normal head CT. Electronically Signed   By: Ulyses Jarred M.D.   On: 07/05/2018 19:13    Procedures Procedures (including critical care time)  Medications Ordered in ED Medications - No data to display   Initial Impression  / Assessment and Plan / ED Course  I have reviewed the triage vital signs and the nursing notes.  Pertinent labs & imaging results that were available during my care of the patient were reviewed by me and considered in my medical decision making (see chart for details).     BP 124/82   Pulse (!) 51   Temp 99.3 F (37.4 C) (Oral)   Resp 18   SpO2 100%    Final Clinical Impressions(s) / ED Diagnoses   Final diagnoses:  Recurrent headache    ED Discharge Orders    None     Headache similar to previous, no fever, neck stiffness, neuro findings or new symptoms to suggest more serious etiology.  I don't think SAH, ICH, meningitis, encephalitis, mass at this time.  No recent trauma.  Labs are reassuring, head CT scan unremarkable.  Patient is bradycardic but has history of bradycardia in the past.  Patient has been  prescribed Maxalt.  At this time, I encourage patient to follow-up with neurology for further evaluation of her condition and also to follow-up with PCP for management.  Patient does have an appointment with neurologist in December.  She is stable for discharge.  Return precautions discussed.    Domenic Moras, PA-C 07/05/18 2312    Hayden Rasmussen, MD 07/06/18 [phone]

## 2018-07-05 NOTE — ED Provider Notes (Signed)
Patient placed in Quick Look pathway, seen and evaluated   Chief Complaint: headache  HPI: Amy Poole is a 47 y.o. female who presents to the ED with c/o headache. Patient reports the headaches have been off and on for the past 3 months and are getting worse. Patient states that this past weekend she had a near syncopal episode. She started to feel hot and had visual changes but did not have LOC. Patient reports blurry vision of right eye.   ROS: Neuro: headache, dizzy  Physical Exam:  BP 102/85 (BP Location: Right Arm)   Pulse (!) 54   Temp 97.8 F (36.6 C) (Oral)   Resp 18   SpO2 100%    Gen: No distress  Neuro: Awake and Alert  Skin: Warm and dry     Initiation of care has begun. The patient has been counseled on the process, plan, and necessity for staying for the completion/evaluation, and the remainder of the medical screening examination    Ashley Murrain, NP 07/05/18 1827    Tegeler, Gwenyth Allegra, MD 07/06/18 [phone]

## 2018-07-05 NOTE — Patient Instructions (Addendum)
[facility] now for CT head. [place] to Emergency Room to check in.    If you have lab work done today you will be contacted with your lab results within the next 2 weeks.  If you have not heard from [place] then please contact us. The fastest way to get your results is to register for My Chart.   IF you received an x-ray today, you will receive an invoice from [facility]. Please contact [facility] at [phone] with questions or concerns regarding your invoice.   IF you received labwork today, you will receive an invoice from [facility]. Please contact [facility] at [phone] with questions or concerns regarding your invoice.   Our billing staff will not be able to assist you with questions regarding bills from these companies.  You will be contacted with the lab results as soon as they are available. The fastest way to get your results is to activate your My Chart account. Instructions are located on the last page of this paperwork. If you have not heard from [place] regarding the results in 2 weeks, please contact this office.

## 2018-07-05 NOTE — Progress Notes (Addendum)
Amy Poole  MRN: [id number] DOB: 1971-02-07  Subjective:  Amy Poole is a 47 y.o. female seen in office today for a chief complaint of headache.   Onset: 3 days ago   Location: right sided    Quality: dull aching  Frequency: all day    Precipitating factors: none she can think of   Prior treatment: aleve and maxalt, aleve helped  Associated Symptoms Nausea/vomiting: yes, mild nausea, no pattern, just here and there  Photophobia/phonophobia: no  Tearing of eyes: no  Sinus pain/pressure: no  Family hx migraine: no  Personal stressors: no  Relation to menstrual cycle: no  Personal hx of migraines: yes, this feels more like a headache to her not a migraine  Red Flags Fever: no  Neck pain/stiffness: no  Vision/speech/swallow/hearing difficulty: New intermittent visual issues and slurred speech for the past 3-4 weeks that comes and goes. Has lost balance and almost fell.   Focal weakness/numbness: no  Altered mental status: no  Trauma: no  New type of headache: yes, not the worst headache of her life Anticoagulant use: no  H/o cancer/HIV/Pregnancy: no   Has appointment with neurology on 08/27/18.  Head CT on 04/15/2018 and MRA of neck w w/o contrast which was negative.  Review of Systems  Per HPI  Patient Active Problem List   Diagnosis Date Noted  . External hemorrhoids 03/15/2018  . Anal pain 03/15/2018  . Asthma, chronic 01/28/2015  . Rhinitis, allergic 01/28/2015    Current Outpatient Medications on File Prior to Visit  Medication Sig Dispense Refill  . albuterol (PROVENTIL HFA;VENTOLIN HFA) 108 (90 BASE) MCG/ACT inhaler Inhale 2 puffs into the lungs every 4 (four) hours as needed for wheezing or shortness of breath (cough, shortness of breath or wheezing.). 1 Inhaler 11  . cetirizine (ZYRTEC) 10 MG tablet Take 10 mg by mouth daily.    . Lidocaine, Anorectal, 5 % CREA Apply 1 application topically 2 (two) times daily. 45 g 0  . rizatriptan (MAXALT) 10 MG  tablet Take 1 tablet (10 mg total) by mouth as needed for migraine. May repeat in 2 hours if needed 10 tablet 1  . Sertraline HCl (ZOLOFT PO) Take by mouth.    . SUMAtriptan (IMITREX) 100 MG tablet Take 100 mg by mouth every 2 (two) hours as needed for migraine. May repeat in 2 hours if headache persists or recurs.     No current facility-administered medications on file prior to visit.     Allergies  Allergen Reactions  . Codeine Nausea And Vomiting  . Penicillins Nausea And Vomiting     Objective:  BP 108/68 (BP Location: Right Arm, Patient Position: Sitting, Cuff Size: Normal)   Pulse 65   Temp 98.5 F (36.9 C) (Oral)   Resp 16   Ht 5\' 4"  (1.626 m)   Wt 156 lb 12.8 oz (71.1 kg)   LMP 05/12/2018 Comment: per pt last week spotted some x 2 days  SpO2 98%   BMI 26.91 kg/m   Physical Exam  Constitutional: She is oriented to person, place, and time. She appears well-developed and well-nourished.  HENT:  Head: Normocephalic and atraumatic.  Mouth/Throat: Uvula is midline, oropharynx is clear and moist and mucous membranes are normal. No tonsillar exudate.  No pain with palpation of bilateral temporal regions.   Eyes: Pupils are equal, round, and reactive to light. Conjunctivae and EOM are normal.  Fundoscopic exam:      The right eye shows no hemorrhage  and no papilledema. The right eye shows red reflex.       The left eye shows no hemorrhage and no papilledema. The left eye shows red reflex.  Neck: Normal range of motion and full passive range of motion without pain. No Brudzinski's sign and no Kernig's sign noted.  Cardiovascular: Normal rate, regular rhythm and normal heart sounds.  Pulmonary/Chest: Effort normal.  Neurological: She is alert and oriented to person, place, and time. She has normal strength and normal reflexes. No cranial nerve deficit or sensory deficit. She displays a negative Romberg sign. Gait normal.  Normal FNF and HTS test.  Normal Tandem walk.   Skin:  Skin is warm and dry.  Psychiatric: She has a normal mood and affect.  Vitals reviewed.  Results for orders placed or performed in visit on 07/05/18 (from the past 24 hour(s))  POCT urinalysis dipstick     Status: Abnormal   Collection Time: 07/05/18  4:24 PM  Result Value Ref Range   Color, UA yellow yellow   Clarity, UA clear clear   Glucose, UA negative negative mg/dL   Bilirubin, UA negative negative   Ketones, POC UA negative negative mg/dL   Spec Grav, UA 1.010 1.010 - 1.025   Blood, UA negative negative   pH, UA 5.5 5.0 - 8.0   Protein Ur, POC negative negative mg/dL   Urobilinogen, UA 0.2 0.2 or 1.0 E.U./dL   Nitrite, UA Negative Negative   Leukocytes, UA Large (3+) (A) Negative  POCT urine pregnancy     Status: Normal   Collection Time: 07/05/18  4:50 PM  Result Value Ref Range   Preg Test, Ur Negative Negative     Assessment and Plan :  1. Acute intractable headache, unspecified headache type Pt is overall well appearing, NAD. Patient denied that this was the worst headache of her life but is having new intermittent visual and speech changes which are different from her previous migraines, will therefore order STAT CT of head (will contact pt with results and discuss further tx plan).  Pt is afebrile with no focal neuro deficits or nuchal rigidity.. Given strict return/ED precautions. Pt voices her understanding.  - POCT urinalysis dipstick - POCT urine pregnancy - CT Head Wo Contrast; Future   Tenna Delaine PA-C  [facility] 07/05/2018 4:14 PM

## 2018-07-05 NOTE — Discharge Instructions (Addendum)
Please follow up with neurologist for further evaluation of your recurrent headache.

## 2018-07-05 NOTE — ED Triage Notes (Signed)
Pt reports headaches intermittent since August. Pt reports her headaches are getting worse. Pt reports over the weekend, she had a near-syncopal episode. Pt reports she started to feel hot and her vision darkened, but she did not loss consciousness. Pt reports blurry vision to R eye. Pt denies CP.

## 2018-07-11 ENCOUNTER — Ambulatory Visit: Payer: BLUE CROSS/BLUE SHIELD | Admitting: Physician Assistant

## 2018-07-13 ENCOUNTER — Other Ambulatory Visit: Payer: Self-pay | Admitting: Obstetrics and Gynecology

## 2018-07-13 ENCOUNTER — Ambulatory Visit
Admission: RE | Admit: 2018-07-13 | Discharge: 2018-07-13 | Disposition: A | Discharge status: Home / Self Care | Payer: BLUE CROSS/BLUE SHIELD | Source: Ambulatory Visit | Attending: Obstetrics and Gynecology | Admitting: Obstetrics and Gynecology

## 2018-07-13 DIAGNOSIS — R922 Inconclusive mammogram: Secondary | ICD-10-CM | POA: Diagnosis not present

## 2018-07-13 DIAGNOSIS — N63 Unspecified lump in unspecified breast: Secondary | ICD-10-CM

## 2018-07-13 DIAGNOSIS — N6001 Solitary cyst of right breast: Secondary | ICD-10-CM | POA: Diagnosis not present

## 2018-07-13 DIAGNOSIS — N6489 Other specified disorders of breast: Secondary | ICD-10-CM

## 2018-08-24 NOTE — Progress Notes (Signed)
NEUROLOGY CONSULTATION NOTE  REBEKKA LOBELLO MRN: [id number] DOB: 08/04/1971  Referring provider: Juanda Crumble, PA-C Primary care provider: Genelle Bal, FNP  Reason for consult: Headaches  HISTORY OF PRESENT ILLNESS: Amy Mcenaney is a 47 year old female who presents for headaches.  History supplemented by ED and referring provider notes.  Onset:  Started in late 20s.  However they have gotten worse since August.  [place], she blacked out and lost consciousness.  There was no preceding warning.  No postictal confusion or incontinence, convulsions or tongue biting.  She has had cardiac workup in the past for bradycardia which was negative.  She had a CT of the head on 04/15/2018 which was personally reviewed and was unremarkable.  Since then, they had become daily. Location: Varies.  Usually right sided.  Sometimes left sided.  Rarely in back of head. Quality: throbbing Intensity:  Usually 6/10.  Sometimes she wakes up with them in the morning.  She denies thunderclap headache Aura:  no Prodrome:  no Postdrome:  Fatigued for a day or two Associated symptoms: Sometimes nausea, vomiting, photophobia, phonophobia, blurred vision.  She denies associated fever, photophobia, phonophobia, unilateral numbness or weakness. Duration:  2 hours most with rizatriptan Frequency:  Daily.  Since on topiramate on 08/08/18, she has had 2 migraines. Frequency of abortive medication: now 2 times past month Triggers:  Unknown Relieving factors:  Cold wet washcloth.  Activity:  Aggravates.  MRA of the neck from 06/21/2018 was personally reviewed and was normal.  She was evaluated in the ED on 07/05/2018 where CT of the head was performed and personally reviewed, which was unremarkable.  CBC and basic metabolic panel were normal.  Of note, she also notes some numbness and tingling in the hands while driving, sometimes when she wakes up in the morning.  This  has been going on for a while.  Sometimes she notes numbness and tingling in the knees for the past month.  Current NSAIDS:  none Current analgesics:  none Current triptans: Maxalt 10 mg Current ergotamine:  none Current anti-emetic:  none Current muscle relaxants:  none Current anti-anxiolytic:  none Current sleep aide:  none Current Antihypertensive medications:  none Current Antidepressant medications:  Sertraline 50mg  at bedtime Current Anticonvulsant medications:  topiramate 25mg  at bedtime Current anti-CGRP:  none Current Vitamins/Herbal/Supplements:  none Current Antihistamines/Decongestants:  none Other therapy:  none Hormone/birth control:  None  Past NSAIDS:  Ibuprofen, naproxen Past analgesics:  Excedrin, Tylenol Past abortive triptans: Sumatriptan 100 mg, Amerge Past abortive ergotamine:  none Past muscle relaxants:  Tizanidine at bedtime (helped) Past anti-emetic:  none Past antihypertensive medications:  none Past antidepressant medications:  none Past anticonvulsant medications:  none Past anti-CGRP:  none Past vitamins/Herbal/Supplements:  none Past antihistamines/decongestants:  none Other past therapies:  none  Caffeine:  Coffee 3 cups daily Diet:  8 to 10 glasses of water daily.  No soda Exercise:  yes Depression:  no; Anxiety:  A little.  Sertraline helps. Other pain:  no Sleep hygiene:  good Family history of headache:  No.  PAST MEDICAL HISTORY: Past Medical History:  Diagnosis Date  . Bradycardia   . Chest pain   . Syncope and collapse     PAST SURGICAL HISTORY: Past Surgical History:  Procedure Laterality Date  . APPENDECTOMY  07/1983  . DOBUTAMINE STRESS ECHO  04/08/2010   NORMAL    MEDICATIONS: Current Outpatient Medications on File Prior to  Visit  Medication Sig Dispense Refill  . albuterol (PROVENTIL HFA;VENTOLIN HFA) 108 (90 BASE) MCG/ACT inhaler Inhale 2 puffs into the lungs every 4 (four) hours as needed for wheezing or  shortness of breath (cough, shortness of breath or wheezing.). 1 Inhaler 11  . cetirizine (ZYRTEC) 10 MG tablet Take 10 mg by mouth daily.    . Lidocaine, Anorectal, 5 % CREA Apply 1 application topically 2 (two) times daily. 45 g 0  . rizatriptan (MAXALT) 10 MG tablet Take 1 tablet (10 mg total) by mouth as needed for migraine. May repeat in 2 hours if needed 10 tablet 1  . Sertraline HCl (ZOLOFT PO) Take by mouth.    . SUMAtriptan (IMITREX) 100 MG tablet Take 100 mg by mouth every 2 (two) hours as needed for migraine. May repeat in 2 hours if headache persists or recurs.     No current facility-administered medications on file prior to visit.     ALLERGIES: Allergies  Allergen Reactions  . Codeine Nausea And Vomiting  . Penicillins Nausea And Vomiting    FAMILY HISTORY: Family History  Problem Relation Age of Onset  . Hypertension Mother   . Diabetes Father   . Heart disease Father   . Cancer Paternal Grandmother        breast  . Breast cancer Paternal Grandmother    SOCIAL HISTORY: Social History   Socioeconomic History  . Marital status: Divorced    Spouse name: Not on file  . Number of children: 0  . Years of education: 12th  . Highest education level: Not on file  Occupational History  . Occupation: Best boy: [employer]    Comment: [employer] [place]  . Financial resource strain: Not on file  . Food insecurity:    Worry: Not on file    Inability: Not on file  . Transportation needs:    Medical: Not on file    Non-medical: Not on file  Tobacco Use  . Smoking status: Never Smoker  . Smokeless tobacco: Never Used  Substance and Sexual Activity  . Alcohol use: Yes    Comment: 1-2 month  . Drug use: No  . Sexual activity: Not on file  Lifestyle  . Physical activity:    Days per week: Not on file    Minutes per session: Not on file  . Stress: Not on file  Relationships  . Social connections:    Talks on phone: Not on file     Gets together: Not on file    Attends religious service: Not on file    Active member of club or organization: Not on file    Attends meetings of clubs or organizations: Not on file    Relationship status: Not on file  . Intimate partner violence:    Fear of current or ex partner: Not on file    Emotionally abused: Not on file    Physically abused: Not on file    Forced sexual activity: Not on file  Other Topics Concern  . Not on file  Social History Narrative   Patient lives at home alone.   Caffeine Use: 1-2 sodas every other day    REVIEW OF SYSTEMS: Constitutional: No fevers, chills, or sweats, no generalized fatigue, change in appetite Eyes: No visual changes, double vision, eye pain Ear, nose and throat: No hearing loss, ear pain, nasal congestion, sore throat Cardiovascular: No chest pain, palpitations Respiratory:  No shortness of breath at  rest or with exertion, wheezes GastrointestinaI: No nausea, vomiting, diarrhea, abdominal pain, fecal incontinence Genitourinary:  No dysuria, urinary retention or frequency Musculoskeletal:  No neck pain, back pain Integumentary: No rash, pruritus, skin lesions Neurological: as above Psychiatric: No depression, insomnia, anxiety Endocrine: No palpitations, fatigue, diaphoresis, mood swings, change in appetite, change in weight, increased thirst Hematologic/Lymphatic:  No purpura, petechiae. Allergic/Immunologic: no itchy/runny eyes, nasal congestion, recent allergic reactions, rashes  PHYSICAL EXAM: Blood pressure 100/70, pulse 60, height 5\' 4"  (1.626 m), weight 161 lb 2 oz (73.1 kg), SpO2 99 %. General: No acute distress.  Patient appears well-groomed. Head:  Normocephalic/atraumatic Eyes:  fundi examined but not visualized Neck: supple, no paraspinal tenderness, full range of motion Back: No paraspinal tenderness Heart: regular rate and rhythm Lungs: Clear to auscultation bilaterally. Vascular: No carotid bruits. Neurological  Exam: Mental status: alert and oriented to person, place, and time, recent and remote memory intact, fund of knowledge intact, attention and concentration intact, speech fluent and not dysarthric, language intact. Cranial nerves: CN I: not tested CN II: pupils equal, round and reactive to light, visual fields intact CN III, IV, VI:  full range of motion, no nystagmus, no ptosis CN V: facial sensation intact CN VII: upper and lower face symmetric CN VIII: hearing intact CN IX, X: gag intact, uvula midline CN XI: sternocleidomastoid and trapezius muscles intact CN XII: tongue midline Bulk & Tone: normal, no fasciculations. Motor:  5/5 throughout  Sensation: temperature and vibration sensation intact. Deep Tendon Reflexes:  2+ throughout, toes downgoing.  Finger to nose testing:  Without dysmetria.  Heel to shin:  Without dysmetria.  Gait:  Normal station and stride.  Able to turn and tandem walk. Romberg negative.  IMPRESSION: 1.  Migraine without aura, without status migrainosus, not intractable 2.  Right lateral intermittent hand numbness likely carpal tunnel syndrome.  Uncertain etiology of numbness and tingling in the knees but possibly secondary to topiramate.   PLAN: 1.  For preventative management, she will continue topiramate 25 mg at bedtime.  If headaches worsen, we can increase dose to 50 mg at bedtime. 2.  For abortive therapy, she will continue rizatriptan 10 mg as needed 3.  Limit use of pain relievers to no more than 2 days out of week to prevent risk of rebound or medication-overuse headache. 4.  Keep headache diary 5.  Exercise, hydration, caffeine cessation, sleep hygiene, monitor for and avoid triggers 6.  Consider:  magnesium citrate 400mg  daily, riboflavin 400mg  daily, and coenzyme Q10 100mg  three times daily 7. For bilateral hand numbness, she will try wearing wrist splints at night.  If symptoms worsen we can get nerve conduction studies. 8. Follow up in 3 to 4  months.   Thank you for allowing me to take part in the care of this patient.  Metta Clines, DO  CC:  Genelle Bal, FNP

## 2018-08-27 ENCOUNTER — Encounter: Payer: Self-pay | Admitting: Neurology

## 2018-08-27 ENCOUNTER — Ambulatory Visit: Payer: BLUE CROSS/BLUE SHIELD | Admitting: Neurology

## 2018-08-27 VITALS — BP 100/70 | HR 60 | Ht 64.0 in | Wt 161.1 lb

## 2018-08-27 DIAGNOSIS — G5603 Carpal tunnel syndrome, bilateral upper limbs: Secondary | ICD-10-CM

## 2018-08-27 DIAGNOSIS — G43009 Migraine without aura, not intractable, without status migrainosus: Secondary | ICD-10-CM

## 2018-08-27 NOTE — Patient Instructions (Signed)
Migraine Recommendations: 1.  Continue topiramate 25mg  at bedtime. 2.  Take rizatriptan 10mg  at earliest onset of headache.  May repeat dose once in 2 hours if needed.  Do not exceed two tablets in 24 hours. 3.  Limit use of pain relievers to no more than 2 days out of the week.  These medications include acetaminophen, ibuprofen, triptans and narcotics.  This will help reduce risk of rebound headaches. 4.  Be aware of common food triggers such as processed sweets, processed foods with nitrites (such as deli meat, hot dogs, sausages), foods with MSG, alcohol (such as wine), chocolate, certain cheeses, certain fruits (dried fruits, bananas, pineapple), vinegar, diet soda. 4.  Avoid caffeine 5.  Routine exercise 6.  Proper sleep hygiene 7.  Stay adequately hydrated with water 8.  Keep a headache diary. 9.  Maintain proper stress management. 10.  Do not skip meals. 11.  Consider supplements:  Magnesium citrate 400mg  to 600mg  daily, riboflavin 400mg , Coenzyme Q 10 100mg  three times daily  The numbness in the hands may be carpal tunnel syndrome.  Recommend wearing wrist splints at night.  If numbness persists or gets worse, we can order a nerve study.  Follow up in 3 to 4 months.

## 2018-10-10 ENCOUNTER — Other Ambulatory Visit: Payer: Self-pay | Admitting: Physician Assistant

## 2018-10-10 DIAGNOSIS — R51 Headache: Principal | ICD-10-CM

## 2018-10-10 DIAGNOSIS — R519 Headache, unspecified: Secondary | ICD-10-CM

## 2018-10-11 NOTE — Telephone Encounter (Signed)
Requested Prescriptions  Pending Prescriptions Disp Refills  . rizatriptan (MAXALT) 10 MG tablet [Pharmacy Med Name: RIZATRIPTAN 10 MG TABLET] 6 tablet 3    Sig: TAKE 1 TABLET BY MOUTH AS NEEDED FOR MIGRAINE. MAY REPEAT IN 2 HOURS IF NEEDED     Neurology:  Migraine Therapy - Triptan Passed - 10/10/2018  7:33 PM      Passed - Last BP in normal range    BP Readings from Last 1 Encounters:  08/27/18 100/70         Passed - Valid encounter within last 12 months    Recent Outpatient Visits          3 months ago Acute intractable headache, unspecified headache type   Primary Care at [facility], [place] D, PA-C   3 months ago Nonintractable headache, unspecified chronicity pattern, unspecified headache type   Primary Care at [facility], Gelene Mink, PA-C   7 months ago External hemorrhoids   Primary Care at [place], [place]   1 year ago Acute upper respiratory infection   Primary Care at [facility] [place] [facility], [place], [place]   2 years ago Injury of left foot, initial encounter   Primary Care at [facility], [place], [place]

## 2019-01-16 ENCOUNTER — Ambulatory Visit: Payer: BLUE CROSS/BLUE SHIELD | Admitting: Neurology

## 2019-01-18 ENCOUNTER — Other Ambulatory Visit: Payer: Self-pay

## 2019-01-18 ENCOUNTER — Ambulatory Visit
Admission: RE | Admit: 2019-01-18 | Discharge: 2019-01-18 | Disposition: A | Discharge status: Home / Self Care | Payer: BLUE CROSS/BLUE SHIELD | Source: Ambulatory Visit | Attending: Obstetrics and Gynecology | Admitting: Obstetrics and Gynecology

## 2019-01-18 DIAGNOSIS — N6489 Other specified disorders of breast: Secondary | ICD-10-CM

## 2019-01-18 DIAGNOSIS — N63 Unspecified lump in unspecified breast: Secondary | ICD-10-CM

## 2019-01-24 NOTE — Progress Notes (Signed)
Virtual Visit via Video Note The purpose of this virtual visit is to provide medical care while limiting exposure to the novel coronavirus.    Consent was obtained for video visit:  Yes.   Answered questions that patient had about telehealth interaction:  Yes.   I discussed the limitations, risks, security and privacy concerns of performing an evaluation and management service by telemedicine. I also discussed with the patient that there may be a patient responsible charge related to this service. The patient expressed understanding and agreed to proceed.  Pt location: Home Physician Location: office Name of referring provider:  Kristen Loader, FNP I connected with Amy Poole at patients initiation/request on 01/25/2019 at  9:50 AM EDT by video enabled telemedicine application and verified that I am speaking with the correct person using two identifiers. Pt MRN:  [id number] Pt DOB:  1971-05-03 Video Participants:  Amy Poole   History of Present Illness:  Amy Poole is a 48 year old woman who follows up for migraines and probable carpal tunnel syndrome.  UPDATE: Intensity:  5-6/10 Duration:  30 to 60 minutes Frequency:  First 3 months on topiramate, she had 3-4 a month.  For past 2 months, 10 days in past 30 days.  Possible stress may have been a trigger. Frequency of abortive medication: took Maxalt 10 days over past 30 days. Current NSAIDS:  none Current analgesics:  none Current triptans: Maxalt 10 mg Current ergotamine:  none Current anti-emetic:  none Current muscle relaxants:  none Current anti-anxiolytic:  none Current sleep aide:  none Current Antihypertensive medications:  none Current Antidepressant medications:  Sertraline 50mg  at bedtime Current Anticonvulsant medications:  topiramate 25mg  at bedtime Current anti-CGRP:  none Current Vitamins/Herbal/Supplements:  none Current Antihistamines/Decongestants:  none Other therapy:  none Hormone/birth  control:  None  Caffeine:  Coffee 3 cups daily Diet:  8 to 10 glasses of water daily.  No soda Exercise:  yes Depression:  no; Anxiety:  A little.  Sertraline helps. Other pain:  no Sleep hygiene:  good  She was advised to wear wrist splints to see if the paresthesias in the hands are reduced.  She never tried it and hasn't noticed it lately.  HISTORY: Onset:  Her late-20s.  However they have gotten worse since August.  [place] in the store, she blacked out and lost consciousness.  There was no preceding warning.  No postictal confusion or incontinence, convulsions or tongue biting.  She has had cardiac workup in the past for bradycardia which was negative.  She had a CT of the head on 04/15/2018 which was personally reviewed and was unremarkable.  Since then, they had become daily. Location: Varies.  Usually right sided.  Sometimes left sided.  Rarely in back of head. Quality: throbbing Initial intensity:  Usually 6/10.  Sometimes she wakes up with them in the morning.  She denies thunderclap headache Aura:  no Prodrome:  no Postdrome:  Fatigued for a day or two Associated symptoms:  Sometimes nausea, vomiting, photophobia, phonophobia, blurred vision.  She denies associated fever, photophobia, phonophobia, unilateral numbness or weakness. Initial Duration:  2 hours most with rizatriptan Initial Frequency:  Daily.  Since on topiramate on 08/08/18, she has had 2 migraines. Initial Frequency of abortive medication: now 2 times past month Triggers:  Unknown Relieving factor:  Cold washcloth.  Activity:  Aggravates.  MRA of the neck from 06/21/2018 was personally reviewed and was normal.  She  was evaluated in the ED on 07/05/2018 where CT of the head was performed and personally reviewed, which was unremarkable.  CBC and basic metabolic panel were normal.  Of note, she also notes some numbness and tingling in the hands while driving, sometimes when she  wakes up in the morning.  This has been going on for a while.  Sometimes she notes numbness and tingling in the knees for the past month.  Past NSAIDS:  Ibuprofen, naproxen Past analgesics:  Excedrin, Tylenol Past abortive triptans: Sumatriptan 100 mg, Amerge Past abortive ergotamine:  none Past muscle relaxants:  Tizanidine at bedtime (helped) Past anti-emetic:  none Past antihypertensive medications:  none Past antidepressant medications:  none Past anticonvulsant medications:  none Past anti-CGRP:  none Past vitamins/Herbal/Supplements:  none Past antihistamines/decongestants:  none Other past therapies:  none  Family history of headache:  No.  Past Medical History: Past Medical History:  Diagnosis Date  . Bradycardia   . Chest pain   . Syncope and collapse     Medications: Outpatient Encounter Medications as of 01/25/2019  Medication Sig Note  . albuterol (PROVENTIL HFA;VENTOLIN HFA) 108 (90 BASE) MCG/ACT inhaler Inhale 2 puffs into the lungs every 4 (four) hours as needed for wheezing or shortness of breath (cough, shortness of breath or wheezing.).   Marland Kitchen cetirizine (ZYRTEC) 10 MG tablet Take 10 mg by mouth daily.   . Lidocaine, Anorectal, 5 % CREA Apply 1 application topically 2 (two) times daily. 07/05/2018: prn  . rizatriptan (MAXALT) 10 MG tablet TAKE 1 TABLET BY MOUTH AS NEEDED FOR MIGRAINE. MAY REPEAT IN 2 HOURS IF NEEDED   . sertraline (ZOLOFT) 50 MG tablet TAKE 1 (ONE) TABLET AND MAY INCREASE TO 2 TABS DAILY   . topiramate (TOPAMAX) 25 MG capsule Take 25 mg by mouth daily.    No facility-administered encounter medications on file as of 01/25/2019.     Allergies: Allergies  Allergen Reactions  . Codeine Nausea And Vomiting  . Penicillins Nausea And Vomiting    Family History: Family History  Problem Relation Age of Onset  . Hypertension Mother   . Diabetes Father   . Heart disease Father   . Cancer Paternal Grandmother        breast  . Breast cancer  Paternal Grandmother     Social History: Social History   Socioeconomic History  . Marital status: Divorced    Spouse name: Not on file  . Number of children: 0  . Years of education: 12th  . Highest education level: High school graduate  Occupational History  . Occupation: cleans houses/self employed  Social Needs  . Financial resource strain: Not on file  . Food insecurity:    Worry: Not on file    Inability: Not on file  . Transportation needs:    Medical: Not on file    Non-medical: Not on file  Tobacco Use  . Smoking status: Never Smoker  . Smokeless tobacco: Never Used  Substance and Sexual Activity  . Alcohol use: Yes    Comment: 1-2 month  . Drug use: No  . Sexual activity: Not on file  Lifestyle  . Physical activity:    Days per week: Not on file    Minutes per session: Not on file  . Stress: Not on file  Relationships  . Social connections:    Talks on phone: Not on file    Gets together: Not on file    Attends religious service: Not on file  Active member of club or organization: Not on file    Attends meetings of clubs or organizations: Not on file    Relationship status: Not on file  . Intimate partner violence:    Fear of current or ex partner: Not on file    Emotionally abused: Not on file    Physically abused: Not on file    Forced sexual activity: Not on file  Other Topics Concern  . Not on file  Social History Narrative   Patient lives with mother in a one story home.  No children.  Works Education administrator houses.  Left handed.  Education: high school.    Caffeine Use: 1-2 sodas every other day   Observations/Objective:   There were no vitals filed for this visit.    Assessment and Plan:   1.  Migraine without aura, without status migrainosus, not intractable.  Frequency has picked up over the past couple of months, possibly due to emotional stress.  1.  For preventative management, increase topiramate to 50mg  at bedtime.  If not improved in  4-6 weeks, we can increase dose to 75mg  at bedtime 2.  For abortive therapy, refill Maxalt 3.  Limit use of pain relievers to no more than 2 days out of week to prevent risk of rebound or medication-overuse headache. 4.  Keep headache diary 5.  Exercise, hydration, caffeine cessation, sleep hygiene, monitor for and avoid triggers 6.  Consider:  magnesium citrate 400mg  daily, riboflavin 400mg  daily, and coenzyme Q10 100mg  three times daily 7. Always keep in mind that currently taking a hormone or birth control may be a possible trigger or aggravating factor for migraine. 8. Follow up in 4 months.   Follow Up Instructions:    -I discussed the assessment and treatment plan with the patient. The patient was provided an opportunity to ask questions and all were answered. The patient agreed with the plan and demonstrated an understanding of the instructions.   The patient was advised to call back or seek an in-person evaluation if the symptoms worsen or if the condition fails to improve as anticipated.      Dudley Major, DO

## 2019-01-25 ENCOUNTER — Encounter: Payer: Self-pay | Admitting: Neurology

## 2019-01-25 ENCOUNTER — Other Ambulatory Visit: Payer: Self-pay

## 2019-01-25 ENCOUNTER — Telehealth (INDEPENDENT_AMBULATORY_CARE_PROVIDER_SITE_OTHER): Payer: BLUE CROSS/BLUE SHIELD | Admitting: Neurology

## 2019-01-25 VITALS — Ht 64.0 in | Wt 150.0 lb

## 2019-01-25 DIAGNOSIS — G43009 Migraine without aura, not intractable, without status migrainosus: Secondary | ICD-10-CM

## 2019-01-25 DIAGNOSIS — R519 Headache, unspecified: Secondary | ICD-10-CM

## 2019-01-25 MED ORDER — RIZATRIPTAN BENZOATE 10 MG PO TABS: ORAL_TABLET | ORAL | 3 refills | Status: AC

## 2019-01-25 MED ORDER — TOPIRAMATE 50 MG PO TABS: 50.0000 mg | ORAL_TABLET | Freq: Every day | ORAL | 3 refills | Status: DC

## 2019-02-07 ENCOUNTER — Telehealth: Payer: Self-pay | Admitting: Neurology

## 2019-02-07 NOTE — Telephone Encounter (Signed)
Pt called again to follow.up on previous message. Pls call her.

## 2019-02-07 NOTE — Telephone Encounter (Signed)
Called and spoke with Pt. We discussed the amount of rizatriptan she has used since Friday, which has been almost daily. I advised her she was not to use more than 2 days a week. She has not started supplements, went over those.  Since Pt was just seen on 5/22, I advised her she will need to give the topiramate time to be effective, at least 4 weeks. We can reassess at that time if we need to increase the dose. Pt will get Gatorade to hydrate and rest in a low lit room and try an ice pack.

## 2019-02-07 NOTE — Telephone Encounter (Signed)
Patient has had a Migraines since Friday. She is asking to be seen sooner than her October appointment. Please Advise. Thanks

## 2019-04-18 ENCOUNTER — Other Ambulatory Visit: Payer: Self-pay | Admitting: Neurology

## 2019-06-05 NOTE — Progress Notes (Deleted)
NEUROLOGY FOLLOW UP OFFICE NOTE  Amy Poole [id number]  HISTORY OF PRESENT ILLNESS: Amy Poole is a 48 year old woman who follows up for migraines UPDATE: Intensity: 5-6/10 Duration:  30-60 minutes Frequency:  *** Frequency of abortive medication: *** Current NSAIDS:none Current analgesics:none Current triptans:Maxalt 10 mg Current ergotamine:none Current anti-emetic:none Current muscle relaxants:none Current anti-anxiolytic:none Current sleep aide:none Current Antihypertensive medications:none Current Antidepressant medications:Sertraline 50mg  at bedtime Current Anticonvulsant medications:topiramate 50mg  at bedtime Current anti-CGRP:none Current Vitamins/Herbal/Supplements:none Current Antihistamines/Decongestants:none Other therapy:none Hormone/birth control: None  Caffeine:Coffee 3 cups daily Diet:8 to 10 glasses of water daily. No soda Exercise:yes Depression:no; Anxiety:A little. Sertraline helps. Other pain:no Sleep hygiene:good   HISTORY: Onset:  Her late-20s. However they have gotten worse since August. [place], she blacked out and lost consciousness. There was no preceding warning. No postictal confusion or incontinence, convulsions or tongue biting. She has had cardiac workup in the past for bradycardia which was negative. She had a CT of the head on 04/15/2018 which was personally reviewed and was unremarkable.Since then, they hadbecome daily. Location:Varies. Usually right sided. Sometimes left sided. Rarely in back of head. Quality:throbbing Initial intensity:Usually 6/10.Sometimes she wakes up with them in the morning. She denies thunderclap headache Aura:no Prodrome:no Postdrome:Fatigued for a day or two Associated symptoms:  Sometimes nausea, vomiting, photophobia, phonophobia,blurred vision.Shedenies associatedfever,  photophobia, phonophobia,unilateral numbness or weakness. Initial Duration:2 hours most with rizatriptan Initial Frequency:Daily. Since on topiramate on 08/08/18, she has had 2 migraines. Initial Frequency of abortive medication:now 2 times past month Triggers:  Unknown Relieving factor:  Cold washcloth. Activity:Aggravates.  MRA of the neck from 06/21/2018 was personally reviewed and was normal. She was evaluated in the ED on 07/05/2018 where CT of the head was performed and personally reviewed, which was unremarkable. CBC and basic metabolic panel werenormal.  Of note, she also notes some numbness and tingling in the hands while driving, sometimes when she wakes up in the morning.This has been going on for a while.Sometimes she notes numbness and tingling in the kneesfor the past month.  Past NSAIDS:Ibuprofen, naproxen Past analgesics:Excedrin, Tylenol Past abortive triptans:Sumatriptan 100 mg, Amerge Past abortive ergotamine:none Past muscle relaxants:Tizanidine at bedtime (helped) Past anti-emetic:none Past antihypertensive medications:none Past antidepressant medications:none Past anticonvulsant medications:none Past anti-CGRP:none Past vitamins/Herbal/Supplements:none Past antihistamines/decongestants:none Other past therapies:none  Family history of headache:No.  PAST MEDICAL HISTORY: Past Medical History:  Diagnosis Date  . Bradycardia   . Chest pain   . Syncope and collapse     MEDICATIONS: Current Outpatient Medications on File Prior to Visit  Medication Sig Dispense Refill  . albuterol (PROVENTIL HFA;VENTOLIN HFA) 108 (90 BASE) MCG/ACT inhaler Inhale 2 puffs into the lungs every 4 (four) hours as needed for wheezing or shortness of breath (cough, shortness of breath or wheezing.). 1 Inhaler 11  . cetirizine (ZYRTEC) 10 MG tablet Take 10 mg by mouth daily.    . Lidocaine, Anorectal, 5 % CREA Apply 1 application  topically 2 (two) times daily. 45 g 0  . rizatriptan (MAXALT) 10 MG tablet TAKE 1 TABLET BY MOUTH AS NEEDED FOR MIGRAINE. MAY REPEAT IN 2 HOURS IF NEEDED.  MAXIMUM 2 TABLETS IN 24 HRS 10 tablet 3  . sertraline (ZOLOFT) 50 MG tablet TAKE 1 (ONE) TABLET AND MAY INCREASE TO 2 TABS DAILY  3  . topiramate (TOPAMAX) 50 MG tablet TAKE 1 TABLET BY MOUTH EVERYDAY AT BEDTIME 90 tablet 1   No current facility-administered medications on  file prior to visit.     ALLERGIES: Allergies  Allergen Reactions  . Codeine Nausea And Vomiting  . Penicillins Nausea And Vomiting    FAMILY HISTORY: Family History  Problem Relation Age of Onset  . Hypertension Mother   . Diabetes Father   . Heart disease Father   . Cancer Paternal Grandmother        breast  . Breast cancer Paternal Grandmother     SOCIAL HISTORY: Social History   Socioeconomic History  . Marital status: Divorced    Spouse name: Not on file  . Number of children: 0  . Years of education: 12th  . Highest education level: High school graduate  Occupational History  . Occupation: cleans houses/self employed  Social Needs  . Financial resource strain: Not on file  . Food insecurity    Worry: Not on file    Inability: Not on file  . Transportation needs    Medical: Not on file    Non-medical: Not on file  Tobacco Use  . Smoking status: Never Smoker  . Smokeless tobacco: Never Used  Substance and Sexual Activity  . Alcohol use: Yes    Comment: 1-2 month  . Drug use: No  . Sexual activity: Not on file  Lifestyle  . Physical activity    Days per week: Not on file    Minutes per session: Not on file  . Stress: Not on file  Relationships  . Social Herbalist on phone: Not on file    Gets together: Not on file    Attends religious service: Not on file    Active member of club or organization: Not on file    Attends meetings of clubs or organizations: Not on file    Relationship status: Not on file  . Intimate  partner violence    Fear of current or ex partner: Not on file    Emotionally abused: Not on file    Physically abused: Not on file    Forced sexual activity: Not on file  Other Topics Concern  . Not on file  Social History Narrative   Patient lives with mother in a one story home.  No children.  Works Education administrator houses.  Left handed.  Education: high school.    Caffeine Use: 1-2 sodas every other day      No longer drinking sodas, drinks 3-4 cups of coffee a day. She walks 3-6 miles a day. 01/25/19    REVIEW OF SYSTEMS: Constitutional: No fevers, chills, or sweats, no generalized fatigue, change in appetite Eyes: No visual changes, double vision, eye pain Ear, nose and throat: No hearing loss, ear pain, nasal congestion, sore throat Cardiovascular: No chest pain, palpitations Respiratory:  No shortness of breath at rest or with exertion, wheezes GastrointestinaI: No nausea, vomiting, diarrhea, abdominal pain, fecal incontinence Genitourinary:  No dysuria, urinary retention or frequency Musculoskeletal:  No neck pain, back pain Integumentary: No rash, pruritus, skin lesions Neurological: as above Psychiatric: No depression, insomnia, anxiety Endocrine: No palpitations, fatigue, diaphoresis, mood swings, change in appetite, change in weight, increased thirst Hematologic/Lymphatic:  No purpura, petechiae. Allergic/Immunologic: no itchy/runny eyes, nasal congestion, recent allergic reactions, rashes  PHYSICAL EXAM: *** General: No acute distress.  Patient appears well-groomed.   Head:  Normocephalic/atraumatic Eyes:  Fundi examined but not visualized Neck: supple, no paraspinal tenderness, full range of motion Heart:  Regular rate and rhythm Lungs:  Clear to auscultation bilaterally Back: No paraspinal tenderness Neurological Exam:  alert and oriented to person, place, and time. Attention span and concentration intact, recent and remote memory intact, fund of knowledge intact.  Speech  fluent and not dysarthric, language intact.  CN II-XII intact. Bulk and tone normal, muscle strength 5/5 throughout.  Sensation to light touch  intact.  Deep tendon reflexes 2+ throughout.  Finger to nose testing intact.  Gait normal, Romberg negative.  IMPRESSION: Migraine without aura, without status migrainosus, not intractable  PLAN: 1.  For preventative management, *** 2.  For abortive therapy, *** 3.  Limit use of pain relievers to no more than 2 days out of week to prevent risk of rebound or medication-overuse headache. 4.  Keep headache diary 5.  Exercise, hydration, caffeine cessation, sleep hygiene, monitor for and avoid triggers 6.  Consider:  magnesium citrate 400mg  daily, riboflavin 400mg  daily, and coenzyme Q10 100mg  three times daily 7. Follow up ***  Metta Clines, DO  CC: Jillyn Ledger, FNP

## 2019-06-07 ENCOUNTER — Ambulatory Visit: Payer: BLUE CROSS/BLUE SHIELD | Admitting: Neurology

## 2019-08-01 IMAGING — US ULTRASOUND RIGHT BREAST LIMITED
1 series · 5 of 5 positions shown · non-contrast
Comparison: Previous exam(s).

CLINICAL DATA: Follow-up probably benign right breast focal
asymmetry and probably benign complicated cyst.

EXAM:
DIGITAL DIAGNOSTIC RIGHT MAMMOGRAM WITH CAD AND TOMO
ULTRASOUND RIGHT BREAST

[Series 1: ultrasound right breast limited · 0.06mm/px · 5 of 5 slices shown]
[im 1/5]
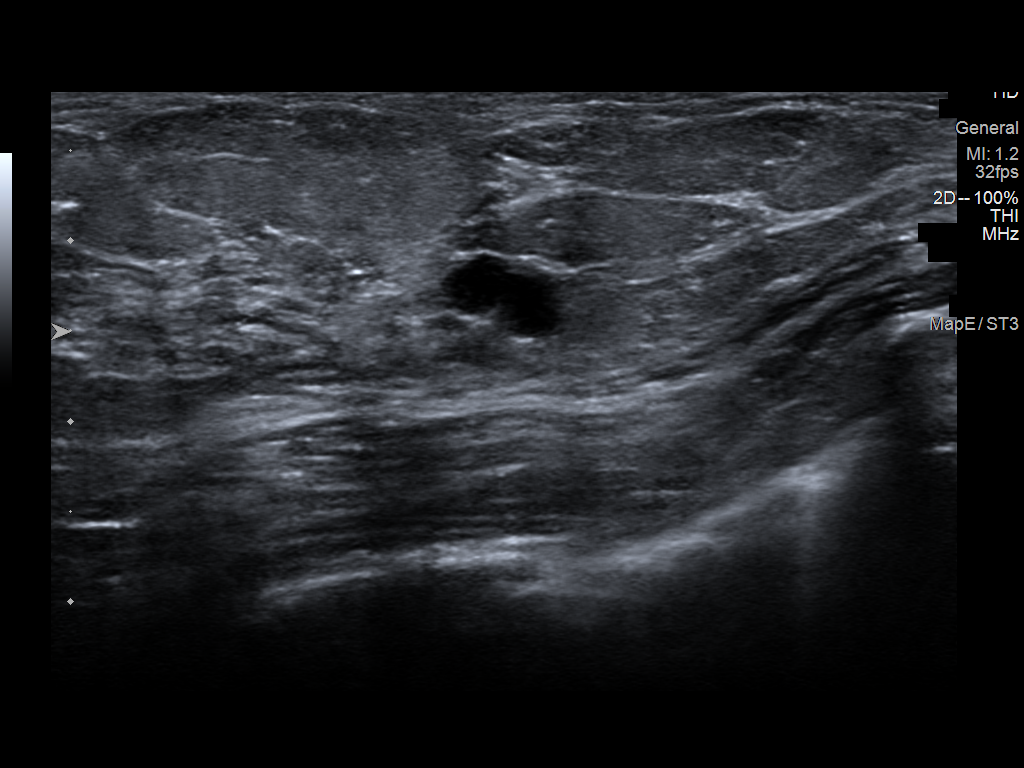
[im 2/5]
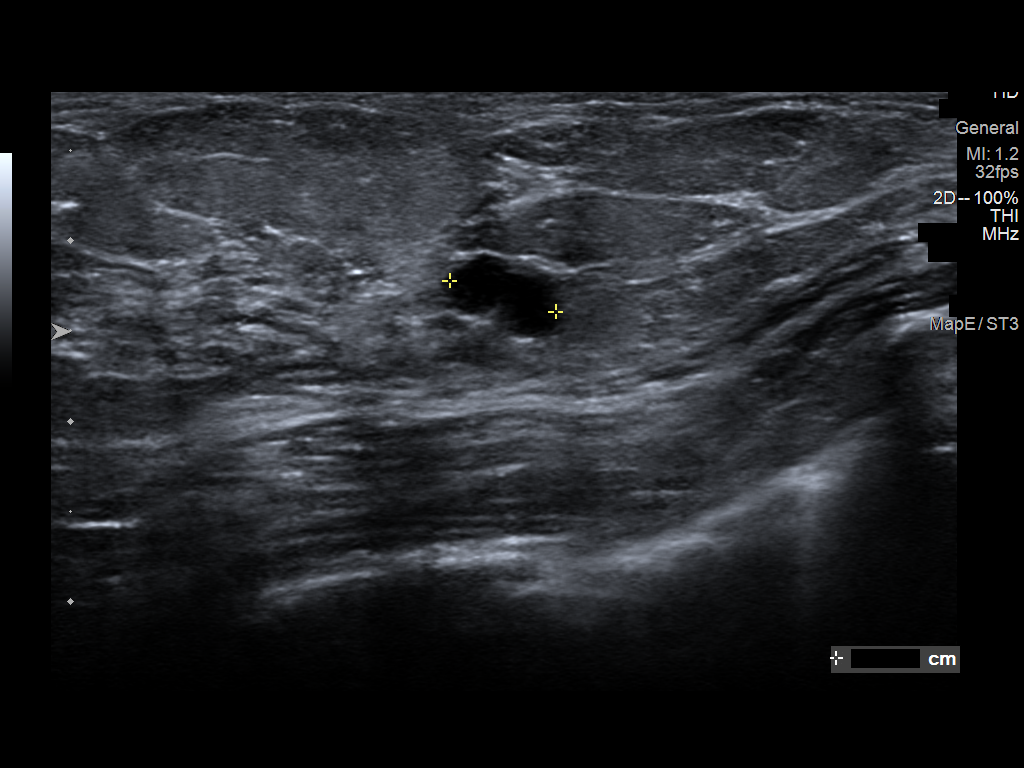
[im 3/5]
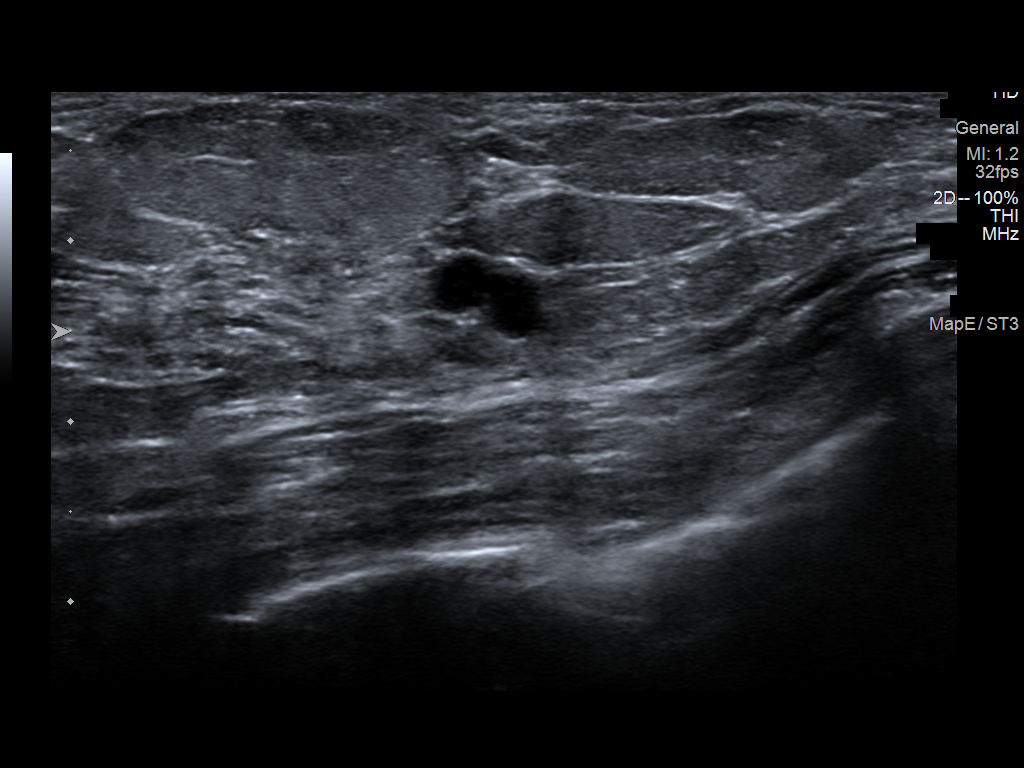
[im 4/5]
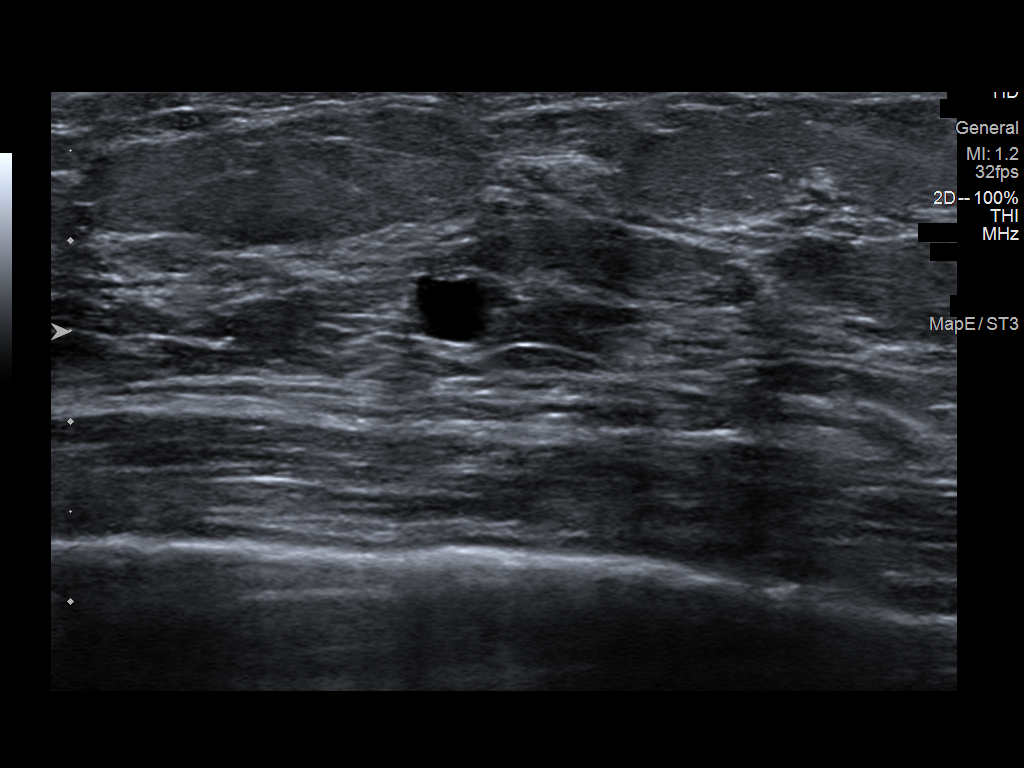
[im 5/5]
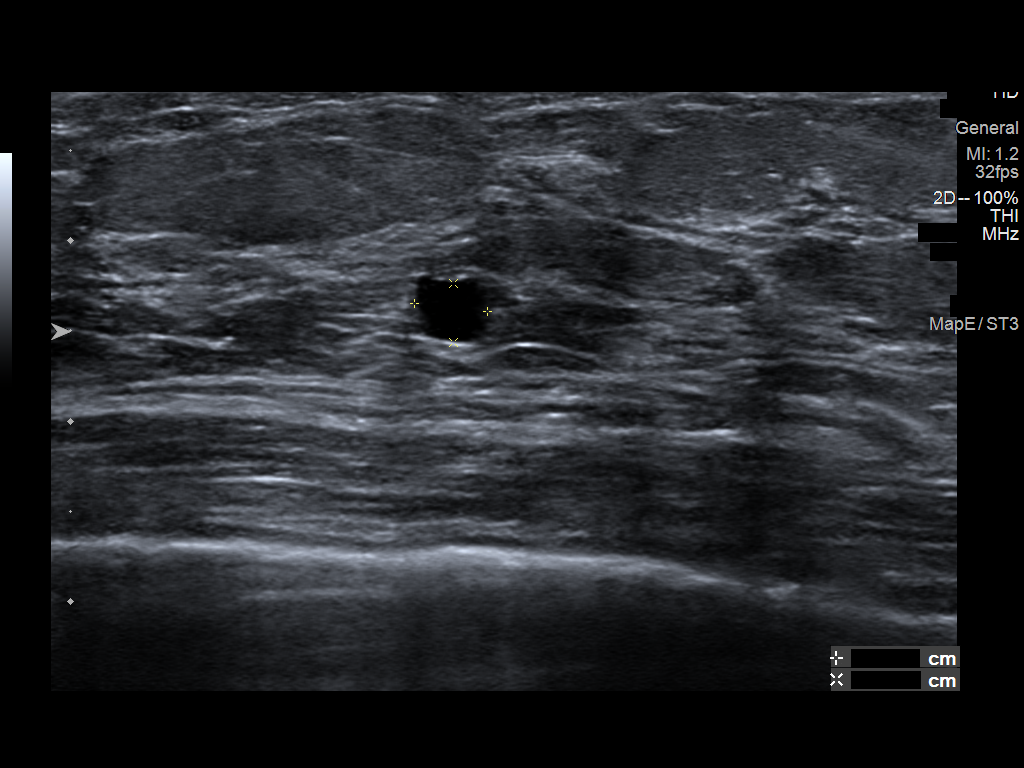

[5 of 5 positions shown; findings below may reference images not displayed]

ACR Breast Density Category c: The breast tissue is heterogeneously
dense, which may obscure small masses.
FINDINGS: 3D tomographic and 2D generated mammographic views of the right
breast demonstrate a stable focal asymmetry in the posterior aspect
of the upper-outer quadrant with appearance compatible with normal
dense glandular tissue. A small, oval, circumscribed and partially
obscured mass in the posterior aspect of the asymmetry is unchanged.
No interval findings suspicious for malignancy.

Mammographic images were processed with CAD.

Targeted ultrasound is performed, showing a 6 x 4 x 3 mm bilobed
cyst with some circumscribed and some indistinct margins in the 10
o'clock position of the right breast, 7 cm from the nipple. This
measured 5 x 4 x 4 mm on 01/05/2018 and had more internal echoes at
that time.
IMPRESSION: Stable probably benign right breast focal asymmetry and slightly
improved appearance of a probably benign right breast mildly
complicated cyst.

RECOMMENDATION:
Bilateral diagnostic mammogram and right breast ultrasound in 6
months.

I have discussed the findings and recommendations with the patient.
Results were also provided in writing at the conclusion of the
visit. If applicable, a reminder letter will be sent to the patient
regarding the next appointment.

BI-RADS CATEGORY  3: Probably benign.

## 2019-08-20 ENCOUNTER — Other Ambulatory Visit: Payer: Self-pay | Admitting: Neurology

## 2019-08-20 NOTE — Telephone Encounter (Signed)
Requested Prescriptions   Pending Prescriptions Disp Refills  . topiramate (TOPAMAX) 50 MG tablet [Pharmacy Med Name: TOPIRAMATE 50 MG TABLET] 90 tablet 1    Sig: TAKE 1 TABLET BY MOUTH EVERYDAY AT BEDTIME   Rx last filled:04/19/19 #90 1 refill   Pt last seen:01/25/19  Follow up appt scheduled:none

## 2019-09-10 ENCOUNTER — Ambulatory Visit: Payer: Self-pay | Attending: Internal Medicine

## 2019-09-10 DIAGNOSIS — Z20822 Contact with and (suspected) exposure to covid-19: Secondary | ICD-10-CM | POA: Insufficient documentation

## 2019-09-11 LAB — NOVEL CORONAVIRUS, NAA: SARS-CoV-2, NAA: NOT DETECTED

## 2019-09-20 ENCOUNTER — Other Ambulatory Visit: Payer: Self-pay | Admitting: Neurology

## 2020-03-20 ENCOUNTER — Other Ambulatory Visit: Payer: Self-pay | Admitting: Neurology

## 2021-04-09 ENCOUNTER — Encounter (HOSPITAL_COMMUNITY): Payer: Self-pay

## 2021-04-09 ENCOUNTER — Other Ambulatory Visit: Payer: Self-pay

## 2021-04-09 ENCOUNTER — Emergency Department (HOSPITAL_COMMUNITY)
Admission: EM | Admit: 2021-04-09 | Discharge: 2021-04-09 | Disposition: A | Discharge status: Home / Self Care | Payer: 59 | Attending: Emergency Medicine | Admitting: Emergency Medicine

## 2021-04-09 DIAGNOSIS — J45998 Other asthma: Secondary | ICD-10-CM | POA: Insufficient documentation

## 2021-04-09 DIAGNOSIS — S0993XA Unspecified injury of face, initial encounter: Secondary | ICD-10-CM | POA: Diagnosis present

## 2021-04-09 DIAGNOSIS — W228XXA Striking against or struck by other objects, initial encounter: Secondary | ICD-10-CM | POA: Insufficient documentation

## 2021-04-09 DIAGNOSIS — S0083XA Contusion of other part of head, initial encounter: Secondary | ICD-10-CM | POA: Diagnosis not present
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
## 2021-04-09 NOTE — Discharge Instructions (Signed)
You are seen in the ER today for your facial injury.  Your physical exam was very reassuring.  You have some bruising, but otherwise your exam was very reassuring and there is no concern for broken bones at this time.  You may utilize Tylenol or ibuprofen as you need to and may ice the area.Dennis Bast may follow-up with your primary care doctor.  Return to ER if develop any blurry or double vision, pain with movement of the eyes, difficulty swallowing, or any other severe symptoms.
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
## 2021-08-03 ENCOUNTER — Encounter: Payer: Self-pay | Admitting: Gastroenterology

## 2021-08-11 NOTE — Progress Notes (Signed)
NEUROLOGY FOLLOW UP OFFICE NOTE  Amy Poole [id number]  Assessment/Plan:   1  Migraine without aura, without status migrainosus, not intractable 2  Multiple symptoms including memory deficits, blurred vision, paresthesias, and gait instability.  Must rule out secondary etiologies, including demyelinating disease.  Some of these symptoms may be due to topiramate but she has been on current dose of topiramate for a couple of years.  Migraine prevention:  I don't want to increase topiramate due to potentially contributing to her current symptoms.  Will discontinue topiramate.  Start Aimovig 140mg  every 28 days.  She has failed topiramate.  She has baseline low blood pressure, so beta blocker not an option.  She is already taking an antidepressant.   Migraine rescue:  rizatriptan 10mg  Limit use of pain relievers to no more than 2 days out of week to prevent risk of rebound or medication-overuse headache. Keep headache diary Follow up after testing.   Subjective:  Amy Poole is a 50 year old woman who follows up for migraines and probable carpal tunnel syndrome.   UPDATE: Last seen in May 2020.  At that time, topiramate was increased to 50mg  at bedtime due to increased migraine frequency. Intensity:  5-6/10 Duration:  30 to 60 minutes Frequency:  They typically occur in clusters.  She may have a few weeks without migraine and then several migraines over the course of a few weeks.  In last couple of months, she has been having a migraine 10 days out of the month.  For several months, she has been experiencing various symptoms.  She has intermittent dizziness, spinning and feels off balance.  She reports numbness and tingling in hands, arms, legs and feet.  Also notes electric shock feeling in the distal left arm.  Constant ringing in the ears.  Feels off balance.  She also endorses blurred vision.  She saw ophthalmology and exam was unremarkable.  .  She also feels chest tightness as  well as short-term memory problems and difficulty concentrating.  No fevers.  She had a rash on the right elbow earlier this year.  She did have COVID in 2020 but no known COVID since onset of these symptoms.  MS does not run in family.    Frequency of abortive medication: took Maxalt 10 days over past 30 days. Current NSAIDS:  none Current analgesics:  none Current triptans: Maxalt 10 mg Current ergotamine:  none Current anti-emetic:  none Current muscle relaxants:  none Current anti-anxiolytic:  none Current sleep aide:  none Current Antihypertensive medications:  none Current Antidepressant medications:  Sertraline 50mg  at bedtime Current Anticonvulsant medications:  topiramate 50mg  at bedtime Current anti-CGRP:  none Current Vitamins/Herbal/Supplements:  none Current Antihistamines/Decongestants:  none Other therapy:  none Hormone/birth control:  None   Caffeine:  Coffee 3 cups daily Diet:  8 to 10 glasses of water daily.  No soda Exercise:  yes Depression:  no; Anxiety:  A little.  Sertraline helps. Other pain:  no Sleep hygiene:  good   She was advised to wear wrist splints to see if the paresthesias in the hands are reduced.  She never tried it and hasn't noticed it lately.   HISTORY: Onset:  Her late-20s.  However they have gotten worse since August.  [place], she blacked out and lost consciousness.  There was no preceding warning.  No postictal confusion or incontinence, convulsions or tongue biting.  She has had cardiac workup  in the past for bradycardia which was negative.  She had a CT of the head on 04/15/2018 which was personally reviewed and was unremarkable.  Since then, they had become daily. Location: Varies.  Usually right sided.  Sometimes left sided.  Rarely in back of head. Quality: throbbing Initial intensity:  Usually 6/10.  Sometimes she wakes up with them in the morning.  She denies thunderclap headache Aura:   no Prodrome:  no Postdrome:  Fatigued for a day or two Associated symptoms:  Sometimes nausea, vomiting, photophobia, phonophobia, blurred vision.  She denies associated fever, photophobia, phonophobia, unilateral numbness or weakness. Initial Duration:  2 hours most with rizatriptan Initial Frequency:  Daily.  Since on topiramate on 08/08/18, she has had 2 migraines. Initial Frequency of abortive medication: now 2 times past month Triggers:  Unknown Relieving factor:  Cold washcloth.  Activity:  Aggravates.   MRA of the neck from 06/21/2018 was personally reviewed and was normal.  She was evaluated in the ED on 07/05/2018 where CT of the head was performed and personally reviewed, which was unremarkable.  CBC and basic metabolic panel were normal.   Of note, she also notes some numbness and tingling in the hands while driving, sometimes when she wakes up in the morning.  This has been going on for a while.  Sometimes she notes numbness and tingling in the knees for the past month.   Past NSAIDS:  Ibuprofen, naproxen Past analgesics:  Excedrin, Tylenol Past abortive triptans: Sumatriptan 100 mg, Amerge Past abortive ergotamine:  none Past muscle relaxants:  Tizanidine at bedtime (helped) Past anti-emetic:  none Past antihypertensive medications:  none Past antidepressant medications:  none Past anticonvulsant medications:  none Past anti-CGRP:  none Past vitamins/Herbal/Supplements:  none Past antihistamines/decongestants:  none Other past therapies:  none   Family history of headache:  No.  PAST MEDICAL HISTORY: Past Medical History:  Diagnosis Date   Bradycardia    Chest pain    Syncope and collapse     MEDICATIONS: Current Outpatient Medications on File Prior to Visit  Medication Sig Dispense Refill   albuterol (PROVENTIL HFA;VENTOLIN HFA) 108 (90 BASE) MCG/ACT inhaler Inhale 2 puffs into the lungs every 4 (four) hours as needed for wheezing or shortness of breath (cough,  shortness of breath or wheezing.). 1 Inhaler 11   cetirizine (ZYRTEC) 10 MG tablet Take 10 mg by mouth daily.     Lidocaine, Anorectal, 5 % CREA Apply 1 application topically 2 (two) times daily. 45 g 0   rizatriptan (MAXALT) 10 MG tablet TAKE 1 TABLET BY MOUTH AS NEEDED FOR MIGRAINE. MAY REPEAT IN 2 HOURS IF NEEDED.  MAXIMUM 2 TABLETS IN 24 HRS 10 tablet 3   sertraline (ZOLOFT) 50 MG tablet TAKE 1 (ONE) TABLET AND MAY INCREASE TO 2 TABS DAILY  3   topiramate (TOPAMAX) 50 MG tablet TAKE 1 TABLET BY MOUTH EVERYDAY AT BEDTIME 90 tablet 1   No current facility-administered medications on file prior to visit.    ALLERGIES: Allergies  Allergen Reactions   Codeine Nausea And Vomiting   Penicillins Nausea And Vomiting    FAMILY HISTORY: Family History  Problem Relation Age of Onset   Hypertension Mother    Diabetes Father    Heart disease Father    Cancer Paternal Grandmother        breast   Breast cancer Paternal Grandmother       Objective:  Blood pressure 107/71, pulse 74, height 5\' 4"  (1.626 m),  weight 168 lb 9.6 oz (76.5 kg), SpO2 99 %. General: No acute distress.  Patient appears well-groomed.   Head:  Normocephalic/atraumatic Eyes:  Fundi examined but not visualized Neck: supple, no paraspinal tenderness, full range of motion Heart:  Regular rate and rhythm Lungs:  Clear to auscultation bilaterally Back: No paraspinal tenderness Neurological Exam: alert and oriented to person, place, and time.  Speech fluent and not dysarthric, language intact.  CN II-XII intact. Bulk and tone normal, muscle strength 5/5 throughout.  Sensation to light touch intact.  Deep tendon reflexes 2+ throughout, toes downgoing.  Finger to nose testing intact.  Gait normal, Romberg negative.   Metta Clines, DO  CC: Crist Infante, MD

## 2021-08-12 ENCOUNTER — Other Ambulatory Visit (INDEPENDENT_AMBULATORY_CARE_PROVIDER_SITE_OTHER): Payer: 59

## 2021-08-12 ENCOUNTER — Encounter: Payer: Self-pay | Admitting: Neurology

## 2021-08-12 ENCOUNTER — Ambulatory Visit: Payer: 59 | Admitting: Neurology

## 2021-08-12 ENCOUNTER — Other Ambulatory Visit: Payer: Self-pay

## 2021-08-12 VITALS — BP 107/71 | HR 74 | Ht 64.0 in | Wt 168.6 lb

## 2021-08-12 DIAGNOSIS — H538 Other visual disturbances: Secondary | ICD-10-CM | POA: Diagnosis not present

## 2021-08-12 DIAGNOSIS — R413 Other amnesia: Secondary | ICD-10-CM

## 2021-08-12 DIAGNOSIS — R2689 Other abnormalities of gait and mobility: Secondary | ICD-10-CM

## 2021-08-12 LAB — TSH: TSH: 0.93 u[IU]/mL (ref 0.35–5.50)

## 2021-08-12 LAB — VITAMIN B12: Vitamin B-12: 1061 pg/mL — ABNORMAL HIGH (ref 211–911)

## 2021-08-12 NOTE — Patient Instructions (Addendum)
Stop topiramate.  Start Aimovig 140mg  every 28 days Take rizatriptan as needed/directed.  Limit use of pain relievers (rizatriptan and over the counter) to no more than 2 days out of week to prevent risk of rebound or medication-overuse headache.  Check MRI of brain with and without contrast Check B12, TSH, Lyme Further recommendations pending results. Follow up

## 2021-08-17 ENCOUNTER — Telehealth: Payer: Self-pay | Admitting: *Deleted

## 2021-08-17 LAB — LYME DISEASE, WESTERN BLOT
IgG P18 Ab.: ABSENT
IgG P23 Ab.: ABSENT
IgG P28 Ab.: ABSENT
IgG P30 Ab.: ABSENT
IgG P39 Ab.: ABSENT
IgG P45 Ab.: ABSENT
IgG P58 Ab.: ABSENT
IgG P66 Ab.: ABSENT
IgG P93 Ab.: ABSENT
IgM P23 Ab.: ABSENT
IgM P39 Ab.: ABSENT
IgM P41 Ab.: ABSENT
Lyme IgG Wb: NEGATIVE
Lyme IgM Wb: NEGATIVE

## 2021-08-17 NOTE — Telephone Encounter (Signed)
Spoke with Latreshia and relayed the following note: she thanked [place]:  Rondel Jumbo, PA-C  P [facility] Clinical Pool Cc: Crist Infante, MD Please, inform the patient that B12 and TSH are  normal. Thank you

## 2021-09-03 ENCOUNTER — Ambulatory Visit
Admission: RE | Admit: 2021-09-03 | Discharge: 2021-09-03 | Disposition: A | Discharge status: Home / Self Care | Payer: 59 | Source: Ambulatory Visit | Attending: Neurology | Admitting: Neurology

## 2021-09-03 ENCOUNTER — Other Ambulatory Visit: Payer: Self-pay

## 2021-09-03 DIAGNOSIS — R413 Other amnesia: Secondary | ICD-10-CM

## 2021-09-03 DIAGNOSIS — R2689 Other abnormalities of gait and mobility: Secondary | ICD-10-CM

## 2021-09-03 DIAGNOSIS — H538 Other visual disturbances: Secondary | ICD-10-CM

## 2021-09-03 MED ORDER — GADOBENATE DIMEGLUMINE 529 MG/ML IV SOLN
15.0000 mL | Freq: Once | INTRAVENOUS | Status: AC | PRN
  Administered 2021-09-03: 09:00:00 15 mL via INTRAVENOUS

## 2021-09-09 ENCOUNTER — Telehealth: Payer: Self-pay

## 2021-09-09 NOTE — Telephone Encounter (Signed)
Pt called an informed that MRI was normal. She asked if she could get Aimovig 140mg  called in she stated that the sample was wonderful. Pt was transferred to the front to have insurance updated

## 2021-09-09 NOTE — Telephone Encounter (Signed)
-----   Message from Pieter Partridge, DO sent at 09/06/2021  5:39 PM EST ----- MRI of brain is normal

## 2021-09-09 NOTE — Telephone Encounter (Signed)
Per Dr. Georgie Chard last note, OK to start Aimovig 140mg  injection.  Will need to initiate PA.

## 2021-09-10 MED ORDER — AIMOVIG 140 MG/ML ~~LOC~~ SOAJ: 140.0000 mg | SUBCUTANEOUS | 5 refills | Status: AC

## 2021-09-10 NOTE — Addendum Note (Signed)
Addended by: Venetia Night on: 09/10/2021 08:50 AM   Modules accepted: Orders

## 2021-09-14 ENCOUNTER — Telehealth: Payer: Self-pay

## 2021-09-14 NOTE — Telephone Encounter (Signed)
New message   DARICA GOREN (Key: [id number]) Rx #: [id number] Aimovig 140MG /ML auto-injectors   Form [employer] [place] Commercial Electronic Request Form (CB) Created 4 days ago Sent to Plan 11 minutes ago Plan Response 11 minutes ago Submit Clinical Questions 1 minute ago Determination Wait for Determination Please wait for [employer] to return a determination.

## 2021-09-14 NOTE — Telephone Encounter (Signed)
F/u  VIANCA BRACHER (Key: [id number]) Rx #: [id number] Aimovig 140MG /ML auto-injectors   Form [employer] Building control surveyor Form (CB) Created 4 days ago Sent to Plan 1 hour ago Plan Response 1 hour ago Submit Clinical Questions 34 minutes ago Determination Wait for Determination Please wait for [employer] to return a determination.

## 2021-09-15 ENCOUNTER — Other Ambulatory Visit: Payer: Self-pay

## 2021-09-15 ENCOUNTER — Ambulatory Visit (AMBULATORY_SURGERY_CENTER): Payer: BC Managed Care – PPO | Admitting: *Deleted

## 2021-09-15 VITALS — Ht 64.0 in | Wt 168.0 lb

## 2021-09-15 DIAGNOSIS — Z1211 Encounter for screening for malignant neoplasm of colon: Secondary | ICD-10-CM

## 2021-09-15 MED ORDER — ONDANSETRON HCL 4 MG PO TABS: 4.0000 mg | ORAL_TABLET | ORAL | 0 refills | Status: DC

## 2021-09-15 MED ORDER — NA SULFATE-K SULFATE-MG SULF 17.5-3.13-1.6 GM/177ML PO SOLN: 1.0000 | ORAL | 0 refills | Status: DC

## 2021-09-15 NOTE — Progress Notes (Signed)

## 2021-09-15 NOTE — Telephone Encounter (Signed)
Amy Poole (Key: [id number]) Rx #: [id number] Aimovig 140MG /ML auto-injectors   Form [employer] Building control surveyor Form (CB) Created 5 days ago Sent to Plan 1 day ago Plan Response 1 day ago Submit Clinical Questions 1 day ago Determination Favorable 22 minutes ago Message from Plan Effective from 09/14/2021 through 12/06/2021.

## 2021-09-16 ENCOUNTER — Telehealth: Payer: Self-pay | Admitting: Gastroenterology

## 2021-09-16 NOTE — Telephone Encounter (Signed)
Called pt- obtained email address [email or web address]- emailed prep instructions to her   Marijean Niemann

## 2021-09-16 NOTE — Telephone Encounter (Signed)
Patient having difficulty getting prep instructions from My Chart.  She asked if you could please email them to her.  Thank you.

## 2021-09-17 DIAGNOSIS — F332 Major depressive disorder, recurrent severe without psychotic features: Secondary | ICD-10-CM | POA: Diagnosis not present

## 2021-09-17 DIAGNOSIS — F411 Generalized anxiety disorder: Secondary | ICD-10-CM | POA: Diagnosis not present

## 2021-09-22 ENCOUNTER — Encounter: Payer: Self-pay | Admitting: Gastroenterology

## 2021-09-29 ENCOUNTER — Ambulatory Visit (AMBULATORY_SURGERY_CENTER): Payer: BC Managed Care – PPO | Admitting: Gastroenterology

## 2021-09-29 ENCOUNTER — Encounter: Payer: Self-pay | Admitting: Gastroenterology

## 2021-09-29 VITALS — BP 103/52 | HR 52 | Temp 98.0°F | Resp 13 | Ht 64.0 in | Wt 168.0 lb

## 2021-09-29 DIAGNOSIS — Z800 Family history of malignant neoplasm of digestive organs: Secondary | ICD-10-CM

## 2021-09-29 DIAGNOSIS — D123 Benign neoplasm of transverse colon: Secondary | ICD-10-CM | POA: Diagnosis not present

## 2021-09-29 DIAGNOSIS — Z1211 Encounter for screening for malignant neoplasm of colon: Secondary | ICD-10-CM

## 2021-09-29 MED ORDER — SODIUM CHLORIDE 0.9 % IV SOLN: 500.0000 mL | Freq: Once | INTRAVENOUS | Status: DC

## 2021-09-29 NOTE — Patient Instructions (Signed)
Handout on polyps and diverticulosis given    YOU HAD AN ENDOSCOPIC PROCEDURE TODAY AT THE [facility]:   Refer to the procedure report that was given to you for any specific questions about what was found during the examination.  If the procedure report does not answer your questions, please call your gastroenterologist to clarify.  If you requested that your care partner not be given the details of your procedure findings, then the procedure report has been included in a sealed envelope for you to review at your convenience later.  YOU SHOULD EXPECT: Some feelings of bloating in the abdomen. Passage of more gas than usual.  Walking can help get rid of the air that was put into your GI tract during the procedure and reduce the bloating. If you had a lower endoscopy (such as a colonoscopy or flexible sigmoidoscopy) you may notice spotting of blood in your stool or on the toilet paper. If you underwent a bowel prep for your procedure, you may not have a normal bowel movement for a few days.  Please Note:  You might notice some irritation and congestion in your nose or some drainage.  This is from the oxygen used during your procedure.  There is no need for concern and it should clear up in a day or so.  SYMPTOMS TO REPORT IMMEDIATELY:   Following lower endoscopy (colonoscopy or flexible sigmoidoscopy):  Excessive amounts of blood in the stool  Significant tenderness or worsening of abdominal pains  Swelling of the abdomen that is new, acute  Fever of 100F or higher   For urgent or emergent issues, a gastroenterologist can be reached at any hour by calling [phone]. Do not use MyChart messaging for urgent concerns.    DIET:  We do recommend a small meal at first, but then you may proceed to your regular diet.  Drink plenty of fluids but you should avoid alcoholic beverages for 24 hours.  ACTIVITY:  You should plan to take it easy for the rest of today and you should NOT  DRIVE or use heavy machinery until tomorrow (because of the sedation medicines used during the test).    FOLLOW UP: Our staff will call the number listed on your records 48-72 hours following your procedure to check on you and address any questions or concerns that you may have regarding the information given to you following your procedure. If we do not reach you, we will leave a message.  We will attempt to reach you two times.  During this call, we will ask if you have developed any symptoms of COVID 19. If you develop any symptoms (ie: fever, flu-like symptoms, shortness of breath, cough etc.) before then, please call [phone].  If you test positive for Covid 19 in the 2 weeks post procedure, please call and report this information to us.    If any biopsies were taken you will be contacted by phone or by letter within the next 1-3 weeks.  Please call us at [phone] if you have not heard about the biopsies in 3 weeks.    SIGNATURES/CONFIDENTIALITY: You and/or your care partner have signed paperwork which will be entered into your electronic medical record.  These signatures attest to the fact that that the information above on your After Visit Summary has been reviewed and is understood.  Full responsibility of the confidentiality of this discharge information lies with you and/or your care-partner. 

## 2021-09-29 NOTE — Progress Notes (Signed)
Pt non-responsive, VVS, Report to RN  °

## 2021-09-29 NOTE — Progress Notes (Signed)
C.W. vital signs. 

## 2021-09-29 NOTE — Progress Notes (Signed)
[facility] History and Physical   Primary Care Physician:  Crist Infante, MD   Reason for Procedure:  family history of colon cancer  Plan:    colonoscopy     HPI: Amy Poole is a 51 y.o. female  here for colonoscopy screening - first time exam, mother had colon cancer age 75. Patient denies any bowel symptoms at this time. Otherwise feels well without any cardiopulmonary symptoms.    Past Medical History:  Diagnosis Date   Anxiety    Asthma    Bradycardia    Cancer (HCC)    skin cancer   Chest pain    Depression    Migraines    Syncope and collapse     Past Surgical History:  Procedure Laterality Date   APPENDECTOMY  07/07/1983   DOBUTAMINE STRESS ECHO  04/08/2010   NORMAL   TOE SURGERY      Prior to Admission medications   Medication Sig Start Date End Date Taking? Authorizing Provider  CALCIUM PO Take by mouth.   Yes [provider]  cetirizine (ZYRTEC) 10 MG tablet Take 10 mg by mouth daily.   Yes [provider]  eszopiclone (LUNESTA) 1 MG TABS tablet Take by mouth at bedtime. 08/25/21  Yes [provider]  fluticasone-salmeterol (ADVAIR) 100-50 MCG/ACT AEPB INHALE 1 PUFF INTO THE LUNGS TWICE A DAY FOR 30 DAYS   Yes [provider]  gabapentin (NEURONTIN) 300 MG capsule Take 300 mg by mouth 3 (three) times daily. 08/08/21  Yes [provider]  montelukast (SINGULAIR) 10 MG tablet Take 10 mg by mouth daily. 07/23/21  Yes [provider]  Multiple Vitamin (MULTIVITAMIN) tablet Take 1 tablet by mouth daily.   Yes [provider]  norethindrone (AYGESTIN) 5 MG tablet Take by mouth. 07/21/21  Yes [provider]  ondansetron (ZOFRAN) 4 MG tablet Take 1 tablet (4 mg total) by mouth as directed. Take one Zofran pill 30-60 minutes before each colonoscopy prep dose 09/15/21  Yes Charlie Char, Carlota Raspberry, MD  Probiotic Product (PROBIOTIC PO) Take by mouth.   Yes [provider]   sertraline (ZOLOFT) 50 MG tablet TAKE 1 (ONE) TABLET AND MAY INCREASE TO 2 TABS DAILY 06/08/18  Yes [provider]  albuterol (PROVENTIL HFA;VENTOLIN HFA) 108 (90 BASE) MCG/ACT inhaler Inhale 2 puffs into the lungs every 4 (four) hours as needed for wheezing or shortness of breath (cough, shortness of breath or wheezing.). 01/28/15   Ezekiel Slocumb, PA-C  Erenumab-aooe (AIMOVIG) 140 MG/ML SOAJ Inject 140 mg into the skin every 30 (thirty) days. 09/10/21   Jaffe, Adam R, DO  rizatriptan (MAXALT) 10 MG tablet TAKE 1 TABLET BY MOUTH AS NEEDED FOR MIGRAINE. MAY REPEAT IN 2 HOURS IF NEEDED.  MAXIMUM 2 TABLETS IN 24 HRS 01/25/19   Pieter Partridge, DO    Current Outpatient Medications  Medication Sig Dispense Refill   CALCIUM PO Take by mouth.     cetirizine (ZYRTEC) 10 MG tablet Take 10 mg by mouth daily.     eszopiclone (LUNESTA) 1 MG TABS tablet Take by mouth at bedtime.     fluticasone-salmeterol (ADVAIR) 100-50 MCG/ACT AEPB INHALE 1 PUFF INTO THE LUNGS TWICE A DAY FOR 30 DAYS     gabapentin (NEURONTIN) 300 MG capsule Take 300 mg by mouth 3 (three) times daily.     montelukast (SINGULAIR) 10 MG tablet Take 10 mg by mouth daily.     Multiple Vitamin (MULTIVITAMIN) tablet Take 1 tablet  by mouth daily.     norethindrone (AYGESTIN) 5 MG tablet Take by mouth.     ondansetron (ZOFRAN) 4 MG tablet Take 1 tablet (4 mg total) by mouth as directed. Take one Zofran pill 30-60 minutes before each colonoscopy prep dose 2 tablet 0   Probiotic Product (PROBIOTIC PO) Take by mouth.     sertraline (ZOLOFT) 50 MG tablet TAKE 1 (ONE) TABLET AND MAY INCREASE TO 2 TABS DAILY  3   albuterol (PROVENTIL HFA;VENTOLIN HFA) 108 (90 BASE) MCG/ACT inhaler Inhale 2 puffs into the lungs every 4 (four) hours as needed for wheezing or shortness of breath (cough, shortness of breath or wheezing.). 1 Inhaler 11   Erenumab-aooe (AIMOVIG) 140 MG/ML SOAJ Inject 140 mg into the skin every 30 (thirty) days. 1.12 mL 5   rizatriptan  (MAXALT) 10 MG tablet TAKE 1 TABLET BY MOUTH AS NEEDED FOR MIGRAINE. MAY REPEAT IN 2 HOURS IF NEEDED.  MAXIMUM 2 TABLETS IN 24 HRS 10 tablet 3   Current Facility-Administered Medications  Medication Dose Route Frequency Provider Last Rate Last Admin   0.9 %  sodium chloride infusion  500 mL Intravenous Once Chelsae Zanella, Carlota Raspberry, MD        Allergies as of 09/29/2021 - Review Complete 09/29/2021  Allergen Reaction Noted   Codeine Nausea And Vomiting 01/11/2012   Penicillins Nausea And Vomiting 01/11/2012    Family History  Problem Relation Age of Onset   Colon cancer Mother 59   Hypertension Mother    Colon polyps Father    Diabetes Father    Heart disease Father    Cancer Paternal Grandmother        breast   Breast cancer Paternal Grandmother    Esophageal cancer Neg Hx    Rectal cancer Neg Hx    Stomach cancer Neg Hx     Social History   Socioeconomic History   Marital status: Divorced    Spouse name: Not on file   Number of children: 0   Years of education: 12th   Highest education level: High school graduate  Occupational History   Occupation: cleans houses/self employed  Tobacco Use   Smoking status: Never   Smokeless tobacco: Never  Vaping Use   Vaping Use: Never used  Substance and Sexual Activity   Alcohol use: Not Currently    Comment: 2-3 months   Drug use: No   Sexual activity: Not on file  Other Topics Concern   Not on file  Social History Narrative   Patient lives with mother in a one story home.  No children.  Works Education administrator houses.  Left handed.  Education: high school.    Caffeine Use: 1-2 sodas every other day      No longer drinking sodas, drinks 3-4 cups of coffee a day. She walks 3-6 miles a day. 01/25/19   Social Determinants of Health   Financial Resource Strain: Not on file  Food Insecurity: Not on file  Transportation Needs: Not on file  Physical Activity: Not on file  Stress: Not on file  Social Connections: Not on file  Intimate  Partner Violence: Not on file    Review of Systems: All other review of systems negative except as mentioned in the HPI.  Physical Exam: Vital signs BP 112/69    Pulse 73    Temp 98 F (36.7 C)    Ht 5\' 4"  (1.626 m)    Wt 168 lb (76.2 kg)    SpO2 100%  BMI 28.84 kg/m   General:   Alert,  Well-developed, pleasant and cooperative in NAD Lungs:  Clear throughout to auscultation.   Heart:  Regular rate and rhythm Abdomen:  Soft, nontender and nondistended.   Neuro/Psych:  Alert and cooperative. Normal mood and affect. A and O x 3  Jolly Mango, MD [facility]

## 2021-09-29 NOTE — Op Note (Signed)
[place] Patient Name: Amy Poole Procedure Date: 09/29/2021 10:23 AM MRN: [id number] Endoscopist: Remo Lipps P. Havery Moros , MD Age: 51 Referring MD:  Date of Birth: 1970-11-29 Gender: Female Account #: [id number] Procedure:                Colonoscopy Indications:              Screening patient at increased risk: Mother with                            colorectal cancer at age 60, first time exam Medicines:                Monitored Anesthesia Care Procedure:                Pre-Anesthesia Assessment:                           - Prior to the procedure, a History and Physical                            was performed, and patient medications and                            allergies were reviewed. The patient's tolerance of                            previous anesthesia was also reviewed. The risks                            and benefits of the procedure and the sedation                            options and risks were discussed with the patient.                            All questions were answered, and informed consent                            was obtained. Prior Anticoagulants: The patient has                            taken no previous anticoagulant or antiplatelet                            agents. ASA Grade Assessment: II - A patient with                            mild systemic disease. After reviewing the risks                            and benefits, the patient was deemed in                            satisfactory condition to undergo the procedure.  After obtaining informed consent, the colonoscope                            was passed under direct vision. Throughout the                            procedure, the patient's blood pressure, pulse, and                            oxygen saturations were monitored continuously. The                            Olympus PCF-H190DL ([id number]) Colonoscope was                            introduced  through the anus and advanced to the the                            cecum, identified by appendiceal orifice and                            ileocecal valve. The colonoscopy was performed                            without difficulty. The patient tolerated the                            procedure well. The quality of the bowel                            preparation was adequate. The ileocecal valve,                            appendiceal orifice, and rectum were photographed. Scope In: 10:31:13 AM Scope Out: 10:52:06 AM Scope Withdrawal Time: 0 hours 17 minutes 51 seconds  Total Procedure Duration: 0 hours 20 minutes 53 seconds  Findings:                 The perianal and digital rectal examinations were                            normal.                           A few small-mouthed diverticula were found in the                            transverse colon and left colon.                           Two sessile polyps were found in the transverse                            colon. The polyps were 2 to 4 mm in size. These  polyps were removed with a cold snare. Resection                            and retrieval were complete.                           Internal hemorrhoids were found during retroflexion.                           Residual stool was noted throughout the colon, time                            was taken to lavage the colon to achieve adequate                            views. The exam was otherwise without abnormality. Complications:            No immediate complications. Estimated blood loss:                            Minimal. Estimated Blood Loss:     Estimated blood loss was minimal. Impression:               - Diverticulosis in the transverse colon and in the                            left colon.                           - Two 2 to 4 mm polyps in the transverse colon,                            removed with a cold snare. Resected and retrieved.                            - Internal hemorrhoids.                           - The examination was otherwise normal. Recommendation:           - Patient has a contact number available for                            emergencies. The signs and symptoms of potential                            delayed complications were discussed with the                            patient. Return to normal activities tomorrow.                            Written discharge instructions were provided to the  patient.                           - Resume previous diet.                           - Continue present medications.                           - Await pathology results. Remo Lipps P. [name], MD 09/29/2021 10:57:17 AM This report has been signed electronically.

## 2021-09-29 NOTE — Progress Notes (Signed)
Pt's states no medical or surgical changes since previsit or office visit. 

## 2021-09-29 NOTE — Progress Notes (Signed)
Called to room to assist during endoscopic procedure.  Patient ID and intended procedure confirmed with present staff. Received instructions for my participation in the procedure from the performing physician.  

## 2021-10-01 ENCOUNTER — Telehealth: Payer: Self-pay | Admitting: *Deleted

## 2021-10-01 NOTE — Telephone Encounter (Signed)
°  Follow up Call-  Call back number 09/29/2021  Post procedure Call Back phone  # [phone]  Permission to leave phone message Yes  Some recent data might be hidden     Patient questions:  Do you have a fever, pain , or abdominal swelling? No. Pain Score  0 *  Have you tolerated food without any problems? Yes.    Have you been able to return to your normal activities? Yes.    Do you have any questions about your discharge instructions: Diet   No. Medications  No. Follow up visit  No.  Do you have questions or concerns about your Care? No.  Actions: * If pain score is 4 or above: No action needed, pain <4.  Have you developed a fever since your procedure? no  2.   Have you had an respiratory symptoms (SOB or cough) since your procedure? no  3.   Have you tested positive for COVID 19 since your procedure no  4.   Have you had any family members/close contacts diagnosed with the COVID 19 since your procedure?  no   If yes to any of these questions please route to Joylene John, RN and Joella Prince, RN

## 2021-10-18 DIAGNOSIS — N920 Excessive and frequent menstruation with regular cycle: Secondary | ICD-10-CM | POA: Diagnosis not present

## 2021-10-26 ENCOUNTER — Other Ambulatory Visit: Payer: Self-pay | Admitting: Neurology

## 2021-11-10 DIAGNOSIS — M25532 Pain in left wrist: Secondary | ICD-10-CM | POA: Diagnosis not present

## 2021-11-10 DIAGNOSIS — M542 Cervicalgia: Secondary | ICD-10-CM | POA: Diagnosis not present

## 2021-11-17 DIAGNOSIS — F332 Major depressive disorder, recurrent severe without psychotic features: Secondary | ICD-10-CM | POA: Diagnosis not present

## 2021-11-17 DIAGNOSIS — F411 Generalized anxiety disorder: Secondary | ICD-10-CM | POA: Diagnosis not present

## 2021-11-24 DIAGNOSIS — M5416 Radiculopathy, lumbar region: Secondary | ICD-10-CM | POA: Diagnosis not present

## 2021-11-29 ENCOUNTER — Telehealth: Payer: Self-pay | Admitting: Pharmacy Technician

## 2021-11-29 NOTE — Telephone Encounter (Signed)
Patient Advocate Encounter ? ?Received notification from [facility] that prior authorization for AIMOVIG '140MG'$  is required. ?  ?PA submitted on 3.27.23 ?Key [id number] ?Status is pending ?  ?[facility] will continue to follow ? ?Raymonda Pell R Ramses Klecka, CPhT ?Patient Advocate ?Phone: [phone] ?Fax:  [phone] ? ?

## 2021-11-30 ENCOUNTER — Other Ambulatory Visit (HOSPITAL_COMMUNITY): Payer: Self-pay

## 2021-11-30 NOTE — Telephone Encounter (Signed)
Received notification from [facility] regarding a prior authorization for AIMOVIG '140MG'$ . Authorization has been APPROVED from 3.27.23 to 3.25.24.  ? ? ? ? ? ?

## 2021-12-06 DIAGNOSIS — M5416 Radiculopathy, lumbar region: Secondary | ICD-10-CM | POA: Diagnosis not present

## 2021-12-08 DIAGNOSIS — M5416 Radiculopathy, lumbar region: Secondary | ICD-10-CM | POA: Diagnosis not present

## 2021-12-09 DIAGNOSIS — E785 Hyperlipidemia, unspecified: Secondary | ICD-10-CM | POA: Diagnosis not present

## 2021-12-13 DIAGNOSIS — R82998 Other abnormal findings in urine: Secondary | ICD-10-CM | POA: Diagnosis not present

## 2021-12-13 DIAGNOSIS — Z0000 Encounter for general adult medical examination without abnormal findings: Secondary | ICD-10-CM | POA: Diagnosis not present

## 2021-12-13 DIAGNOSIS — R202 Paresthesia of skin: Secondary | ICD-10-CM | POA: Diagnosis not present

## 2021-12-13 DIAGNOSIS — Z1331 Encounter for screening for depression: Secondary | ICD-10-CM | POA: Diagnosis not present

## 2021-12-13 DIAGNOSIS — E785 Hyperlipidemia, unspecified: Secondary | ICD-10-CM | POA: Diagnosis not present

## 2021-12-13 DIAGNOSIS — Z1339 Encounter for screening examination for other mental health and behavioral disorders: Secondary | ICD-10-CM | POA: Diagnosis not present

## 2022-01-21 DIAGNOSIS — D225 Melanocytic nevi of trunk: Secondary | ICD-10-CM | POA: Diagnosis not present

## 2022-01-21 DIAGNOSIS — Z85828 Personal history of other malignant neoplasm of skin: Secondary | ICD-10-CM | POA: Diagnosis not present

## 2022-01-21 DIAGNOSIS — D2271 Melanocytic nevi of right lower limb, including hip: Secondary | ICD-10-CM | POA: Diagnosis not present

## 2022-01-21 DIAGNOSIS — D2272 Melanocytic nevi of left lower limb, including hip: Secondary | ICD-10-CM | POA: Diagnosis not present

## 2022-02-01 DIAGNOSIS — R051 Acute cough: Secondary | ICD-10-CM | POA: Diagnosis not present

## 2022-02-01 DIAGNOSIS — J069 Acute upper respiratory infection, unspecified: Secondary | ICD-10-CM | POA: Diagnosis not present

## 2022-02-01 DIAGNOSIS — J029 Acute pharyngitis, unspecified: Secondary | ICD-10-CM | POA: Diagnosis not present

## 2022-02-18 DIAGNOSIS — F332 Major depressive disorder, recurrent severe without psychotic features: Secondary | ICD-10-CM | POA: Diagnosis not present

## 2022-02-18 DIAGNOSIS — F411 Generalized anxiety disorder: Secondary | ICD-10-CM | POA: Diagnosis not present

## 2022-03-16 ENCOUNTER — Ambulatory Visit: Payer: 59 | Admitting: Neurology

## 2022-04-06 DIAGNOSIS — Z6829 Body mass index (BMI) 29.0-29.9, adult: Secondary | ICD-10-CM | POA: Diagnosis not present

## 2022-04-06 DIAGNOSIS — Z1231 Encounter for screening mammogram for malignant neoplasm of breast: Secondary | ICD-10-CM | POA: Diagnosis not present

## 2022-04-06 DIAGNOSIS — Z01419 Encounter for gynecological examination (general) (routine) without abnormal findings: Secondary | ICD-10-CM | POA: Diagnosis not present

## 2022-04-15 DIAGNOSIS — M542 Cervicalgia: Secondary | ICD-10-CM | POA: Diagnosis not present

## 2022-04-23 DIAGNOSIS — M542 Cervicalgia: Secondary | ICD-10-CM | POA: Diagnosis not present

## 2022-04-29 DIAGNOSIS — M25562 Pain in left knee: Secondary | ICD-10-CM | POA: Diagnosis not present

## 2022-05-03 DIAGNOSIS — M5032 Other cervical disc degeneration, mid-cervical region, unspecified level: Secondary | ICD-10-CM | POA: Diagnosis not present

## 2022-05-03 DIAGNOSIS — M5412 Radiculopathy, cervical region: Secondary | ICD-10-CM | POA: Diagnosis not present

## 2022-05-20 DIAGNOSIS — F332 Major depressive disorder, recurrent severe without psychotic features: Secondary | ICD-10-CM | POA: Diagnosis not present

## 2022-05-20 DIAGNOSIS — M5412 Radiculopathy, cervical region: Secondary | ICD-10-CM | POA: Diagnosis not present

## 2022-05-20 DIAGNOSIS — F411 Generalized anxiety disorder: Secondary | ICD-10-CM | POA: Diagnosis not present

## 2022-05-20 DIAGNOSIS — M6281 Muscle weakness (generalized): Secondary | ICD-10-CM | POA: Diagnosis not present

## 2022-05-30 DIAGNOSIS — M5412 Radiculopathy, cervical region: Secondary | ICD-10-CM | POA: Diagnosis not present

## 2022-05-30 DIAGNOSIS — M6281 Muscle weakness (generalized): Secondary | ICD-10-CM | POA: Diagnosis not present

## 2022-06-01 DIAGNOSIS — M6281 Muscle weakness (generalized): Secondary | ICD-10-CM | POA: Diagnosis not present

## 2022-06-01 DIAGNOSIS — M5412 Radiculopathy, cervical region: Secondary | ICD-10-CM | POA: Diagnosis not present

## 2022-06-06 DIAGNOSIS — M6281 Muscle weakness (generalized): Secondary | ICD-10-CM | POA: Diagnosis not present

## 2022-06-06 DIAGNOSIS — M5412 Radiculopathy, cervical region: Secondary | ICD-10-CM | POA: Diagnosis not present

## 2022-06-08 DIAGNOSIS — M6281 Muscle weakness (generalized): Secondary | ICD-10-CM | POA: Diagnosis not present

## 2022-06-08 DIAGNOSIS — M5412 Radiculopathy, cervical region: Secondary | ICD-10-CM | POA: Diagnosis not present

## 2022-06-14 DIAGNOSIS — N9489 Other specified conditions associated with female genital organs and menstrual cycle: Secondary | ICD-10-CM | POA: Diagnosis not present

## 2022-06-14 DIAGNOSIS — N9089 Other specified noninflammatory disorders of vulva and perineum: Secondary | ICD-10-CM | POA: Diagnosis not present

## 2022-06-17 DIAGNOSIS — M6281 Muscle weakness (generalized): Secondary | ICD-10-CM | POA: Diagnosis not present

## 2022-06-17 DIAGNOSIS — M5412 Radiculopathy, cervical region: Secondary | ICD-10-CM | POA: Diagnosis not present

## 2022-06-20 DIAGNOSIS — M6281 Muscle weakness (generalized): Secondary | ICD-10-CM | POA: Diagnosis not present

## 2022-06-20 DIAGNOSIS — M5412 Radiculopathy, cervical region: Secondary | ICD-10-CM | POA: Diagnosis not present

## 2022-06-24 DIAGNOSIS — M5412 Radiculopathy, cervical region: Secondary | ICD-10-CM | POA: Diagnosis not present

## 2022-06-24 DIAGNOSIS — F332 Major depressive disorder, recurrent severe without psychotic features: Secondary | ICD-10-CM | POA: Diagnosis not present

## 2022-06-24 DIAGNOSIS — M6281 Muscle weakness (generalized): Secondary | ICD-10-CM | POA: Diagnosis not present

## 2022-06-24 DIAGNOSIS — Z79899 Other long term (current) drug therapy: Secondary | ICD-10-CM | POA: Diagnosis not present

## 2022-06-24 DIAGNOSIS — F411 Generalized anxiety disorder: Secondary | ICD-10-CM | POA: Diagnosis not present

## 2022-06-27 DIAGNOSIS — M6281 Muscle weakness (generalized): Secondary | ICD-10-CM | POA: Diagnosis not present

## 2022-06-27 DIAGNOSIS — M5412 Radiculopathy, cervical region: Secondary | ICD-10-CM | POA: Diagnosis not present

## 2022-06-29 DIAGNOSIS — M5412 Radiculopathy, cervical region: Secondary | ICD-10-CM | POA: Diagnosis not present

## 2022-06-29 DIAGNOSIS — M6281 Muscle weakness (generalized): Secondary | ICD-10-CM | POA: Diagnosis not present

## 2022-07-04 DIAGNOSIS — M5412 Radiculopathy, cervical region: Secondary | ICD-10-CM | POA: Diagnosis not present

## 2022-07-04 DIAGNOSIS — M6281 Muscle weakness (generalized): Secondary | ICD-10-CM | POA: Diagnosis not present

## 2022-07-07 DIAGNOSIS — B009 Herpesviral infection, unspecified: Secondary | ICD-10-CM | POA: Diagnosis not present

## 2022-07-08 DIAGNOSIS — M6281 Muscle weakness (generalized): Secondary | ICD-10-CM | POA: Diagnosis not present

## 2022-07-08 DIAGNOSIS — M5412 Radiculopathy, cervical region: Secondary | ICD-10-CM | POA: Diagnosis not present

## 2022-07-11 DIAGNOSIS — M6281 Muscle weakness (generalized): Secondary | ICD-10-CM | POA: Diagnosis not present

## 2022-07-11 DIAGNOSIS — M5412 Radiculopathy, cervical region: Secondary | ICD-10-CM | POA: Diagnosis not present

## 2022-07-15 DIAGNOSIS — M6281 Muscle weakness (generalized): Secondary | ICD-10-CM | POA: Diagnosis not present

## 2022-07-15 DIAGNOSIS — M5412 Radiculopathy, cervical region: Secondary | ICD-10-CM | POA: Diagnosis not present

## 2022-07-18 DIAGNOSIS — M6281 Muscle weakness (generalized): Secondary | ICD-10-CM | POA: Diagnosis not present

## 2022-07-18 DIAGNOSIS — M5412 Radiculopathy, cervical region: Secondary | ICD-10-CM | POA: Diagnosis not present

## 2022-07-20 DIAGNOSIS — H5213 Myopia, bilateral: Secondary | ICD-10-CM | POA: Diagnosis not present

## 2022-07-20 DIAGNOSIS — G43909 Migraine, unspecified, not intractable, without status migrainosus: Secondary | ICD-10-CM | POA: Diagnosis not present

## 2022-07-20 DIAGNOSIS — H52203 Unspecified astigmatism, bilateral: Secondary | ICD-10-CM | POA: Diagnosis not present

## 2022-07-22 DIAGNOSIS — M6281 Muscle weakness (generalized): Secondary | ICD-10-CM | POA: Diagnosis not present

## 2022-07-22 DIAGNOSIS — M5412 Radiculopathy, cervical region: Secondary | ICD-10-CM | POA: Diagnosis not present

## 2022-08-09 DIAGNOSIS — M5126 Other intervertebral disc displacement, lumbar region: Secondary | ICD-10-CM | POA: Diagnosis not present

## 2022-08-09 DIAGNOSIS — M5416 Radiculopathy, lumbar region: Secondary | ICD-10-CM | POA: Diagnosis not present

## 2022-08-11 ENCOUNTER — Encounter (HOSPITAL_BASED_OUTPATIENT_CLINIC_OR_DEPARTMENT_OTHER): Payer: Self-pay | Admitting: *Deleted

## 2022-08-11 ENCOUNTER — Emergency Department (HOSPITAL_BASED_OUTPATIENT_CLINIC_OR_DEPARTMENT_OTHER): Payer: BC Managed Care – PPO | Admitting: Radiology

## 2022-08-11 ENCOUNTER — Other Ambulatory Visit: Payer: Self-pay

## 2022-08-11 ENCOUNTER — Emergency Department (HOSPITAL_BASED_OUTPATIENT_CLINIC_OR_DEPARTMENT_OTHER)
Admission: EM | Admit: 2022-08-11 | Discharge: 2022-08-12 | Disposition: A | Discharge status: Home / Self Care | Payer: BC Managed Care – PPO | Attending: Emergency Medicine | Admitting: Emergency Medicine

## 2022-08-11 DIAGNOSIS — M48061 Spinal stenosis, lumbar region without neurogenic claudication: Secondary | ICD-10-CM | POA: Diagnosis not present

## 2022-08-11 DIAGNOSIS — M549 Dorsalgia, unspecified: Secondary | ICD-10-CM | POA: Diagnosis not present

## 2022-08-11 DIAGNOSIS — M5442 Lumbago with sciatica, left side: Secondary | ICD-10-CM | POA: Diagnosis not present

## 2022-08-11 DIAGNOSIS — M5440 Lumbago with sciatica, unspecified side: Secondary | ICD-10-CM | POA: Diagnosis not present

## 2022-08-11 DIAGNOSIS — M545 Low back pain, unspecified: Secondary | ICD-10-CM | POA: Diagnosis not present

## 2022-08-11 DIAGNOSIS — R202 Paresthesia of skin: Secondary | ICD-10-CM | POA: Diagnosis not present

## 2022-08-11 MED ORDER — LIDOCAINE 5 % EX PTCH: 1.0000 | MEDICATED_PATCH | CUTANEOUS | 0 refills | Status: DC

## 2022-08-11 MED ORDER — HYDROCODONE-ACETAMINOPHEN 5-325 MG PO TABS: 1.0000 | ORAL_TABLET | Freq: Four times a day (QID) | ORAL | 0 refills | Status: DC | PRN

## 2022-08-11 MED ORDER — ONDANSETRON HCL 4 MG/2ML IJ SOLN
4.0000 mg | Freq: Once | INTRAMUSCULAR | Status: AC
  Administered 2022-08-11: 4 mg via INTRAVENOUS
  Filled 2022-08-11: qty 2

## 2022-08-11 MED ORDER — PREDNISONE 50 MG PO TABS: 50.0000 mg | ORAL_TABLET | Freq: Every day | ORAL | 0 refills | Status: DC

## 2022-08-11 MED ORDER — HYDROMORPHONE HCL 1 MG/ML IJ SOLN
1.0000 mg | Freq: Once | INTRAMUSCULAR | Status: AC
  Administered 2022-08-11: 1 mg via INTRAVENOUS
  Filled 2022-08-11: qty 1

## 2022-08-11 NOTE — ED Notes (Signed)
Pt remains awake and alert- reporting pain is now increasing again - rated 6/10; reports pain had improved to 2/10 after Dilaudid was administered.  Pt denies numbness to LE while lying still in bed; reports "only pain" at this time

## 2022-08-11 NOTE — ED Provider Notes (Addendum)
[facility] EMERGENCY DEPT Provider Note   CSN: [id number] Arrival date & time: 08/11/22  1459     History  Chief Complaint  Patient presents with   Back Pain    Amy Poole is a 51 y.o. female.   Back Pain    Patient had history of syncope bradycardia depression migraines, skin cancer.  She also has been having trouble with low back pain.  Patient states she has had trouble for over a year.  She had an MRI previously that did not show any serious problems.  Patient has had an exacerbation over the last week.  It started after a twisting injury.  Patient did not fall.  She saw her orthopedic doctor recently.  She was given a shot of some type of medication.  Patient states she is scheduled to have another shot tomorrow that may be an epidural.  Today however her pain was more severe.  It is in her left back and buttock.  She does feel some numbness in her leg.  She has not had any weakness.  No incontinence.  Home Medications Prior to Admission medications   Medication Sig Start Date End Date Taking? Authorizing Provider  HYDROcodone-acetaminophen (NORCO/VICODIN) 5-325 MG tablet Take 1 tablet by mouth every 6 (six) hours as needed for severe pain. 08/11/22  Yes Dorie Rank, MD  lidocaine (LIDODERM) 5 % Place 1 patch onto the skin daily. Remove & Discard patch within 12 hours or as directed by MD 08/11/22  Yes Dorie Rank, MD  predniSONE (DELTASONE) 50 MG tablet Take 1 tablet (50 mg total) by mouth daily. 08/11/22  Yes Dorie Rank, MD  albuterol (PROVENTIL HFA;VENTOLIN HFA) 108 (90 BASE) MCG/ACT inhaler Inhale 2 puffs into the lungs every 4 (four) hours as needed for wheezing or shortness of breath (cough, shortness of breath or wheezing.). 01/28/15   Ezekiel Slocumb, PA-C  CALCIUM PO Take by mouth.    [provider]  cetirizine (ZYRTEC) 10 MG tablet Take 10 mg by mouth daily.    [provider]  Erenumab-aooe (AIMOVIG) 140 MG/ML SOAJ Inject 140 mg into  the skin every 30 (thirty) days. 09/10/21   Tomi Likens, Adam R, DO  eszopiclone (LUNESTA) 1 MG TABS tablet Take by mouth at bedtime. 08/25/21   [provider]  fluticasone-salmeterol (ADVAIR) 100-50 MCG/ACT AEPB INHALE 1 PUFF INTO THE LUNGS TWICE A DAY FOR 30 DAYS    [provider]  gabapentin (NEURONTIN) 300 MG capsule Take 300 mg by mouth 3 (three) times daily. 08/08/21   [provider]  montelukast (SINGULAIR) 10 MG tablet Take 10 mg by mouth daily. 07/23/21   [provider]  Multiple Vitamin (MULTIVITAMIN) tablet Take 1 tablet by mouth daily.    [provider]  norethindrone (AYGESTIN) 5 MG tablet Take by mouth. 07/21/21   [provider]  ondansetron (ZOFRAN) 4 MG tablet Take 1 tablet (4 mg total) by mouth as directed. Take one Zofran pill 30-60 minutes before each colonoscopy prep dose 09/15/21   Armbruster, Carlota Raspberry, MD  Probiotic Product (PROBIOTIC PO) Take by mouth.    [provider]  rizatriptan (MAXALT) 10 MG tablet TAKE 1 TABLET BY MOUTH AS NEEDED FOR MIGRAINE. MAY REPEAT IN 2 HOURS IF NEEDED.  MAXIMUM 2 TABLETS IN 24 HRS 01/25/19   Jaffe, Adam R, DO  sertraline (ZOLOFT) 50 MG tablet TAKE 1 (ONE) TABLET AND MAY INCREASE TO 2 TABS DAILY 06/08/18   [provider]  Allergies    Codeine and Penicillins    Review of Systems   Review of Systems  Musculoskeletal:  Positive for back pain.    Physical Exam Updated Vital Signs BP 121/69   Pulse 66   Temp (!) 97.5 F (36.4 C) (Oral)   Resp 16   SpO2 100%  Physical Exam Vitals and nursing note reviewed.  Constitutional:      General: She is not in acute distress.    Appearance: She is well-developed.  HENT:     Head: Normocephalic and atraumatic.     Right Ear: External ear normal.     Left Ear: External ear normal.  Eyes:     General: No scleral icterus.       Right eye: No discharge.        Left eye: No discharge.     Conjunctiva/sclera: Conjunctivae  normal.  Neck:     Trachea: No tracheal deviation.  Cardiovascular:     Rate and Rhythm: Normal rate.  Pulmonary:     Effort: Pulmonary effort is normal. No respiratory distress.     Breath sounds: No stridor.  Abdominal:     General: There is no distension.  Musculoskeletal:        General: No swelling or deformity.     Cervical back: Neck supple.     Comments: Tenderness palpation left paraspinal and buttock region  Skin:    General: Skin is warm and dry.     Findings: No rash.  Neurological:     Mental Status: She is alert.     Cranial Nerves: Cranial nerve deficit: no gross deficits.     Comments: Sensation intact bilateral lower extremities, 5 out of 5 plantar and dorsiflexion strength bilateral lower extremities     ED Results / Procedures / Treatments   Labs (all labs ordered are listed, but only abnormal results are displayed) Labs Reviewed - No data to display  EKG None  Radiology DG Lumbar Spine 2-3 Views  Result Date: 08/11/2022 CLINICAL DATA:  Chronic back pain, severe low back pain for 1 week after tripping and twisting EXAM: LUMBAR SPINE - 2-3 VIEW COMPARISON:  None Available. FINDINGS: Five non-rib-bearing lumbar vertebra. Vertebral body heights maintained. Disc space narrowing L3-L4 and L4-L5 with minimal endplate spurring T7-G0. No fracture, subluxation, or bone destruction. SI joints preserved. IUD and LEFT Essure wire noted in pelvis. IMPRESSION: Mild degenerative disc disease changes. No acute osseous abnormalities. Electronically Signed   By: Lavonia Dana M.D.   On: 08/11/2022 17:19    Procedures Procedures    Medications Ordered in ED Medications  HYDROmorphone (DILAUDID) injection 1 mg (has no administration in time range)  HYDROmorphone (DILAUDID) injection 1 mg (1 mg Intravenous Given 08/11/22 1947)  ondansetron (ZOFRAN) injection 4 mg (4 mg Intravenous Given 08/11/22 1947)    ED Course/ Medical Decision Making/ A&P Clinical Course as of 08/11/22  2326  Thu Aug 11, 2022  2318 X-rays without acute abnormalities [JK]    Clinical Course User Index [JK] Dorie Rank, MD                           Medical Decision Making Amount and/or Complexity of Data Reviewed Radiology: ordered and independent interpretation performed.  Risk Prescription drug management. Parenteral controlled substances.   Patient presented to the ED for complaints of low back pain.  Symptoms suggestive of sciatica.  Patient without acute focal neurologic deficits.  Patient has  been seeing an orthopedic doctor.  Suspect she may end up requiring MRI but no emergent need for imaging at this time and MRI is not available at this facility.  Will discharge home with medications for pain.  Continued outpatient follow-up with your orthopedist.        Final Clinical Impression(s) / ED Diagnoses Final diagnoses:  Low back pain with sciatica, sciatica laterality unspecified, unspecified back pain laterality, unspecified chronicity    Rx / DC Orders ED Discharge Orders          Ordered    HYDROcodone-acetaminophen (NORCO/VICODIN) 5-325 MG tablet  Every 6 hours PRN        08/11/22 2322    lidocaine (LIDODERM) 5 %  Every 24 hours        08/11/22 2322    predniSONE (DELTASONE) 50 MG tablet  Daily        08/11/22 2322                 Dorie Rank, MD 08/11/22 2327

## 2022-08-11 NOTE — ED Notes (Signed)
Pt remains awake and alert- reports pain now 9/10- 2nd dose of IVP Dilaudid has been administered - plan to d/c home; pt mother at bedside will be patient driver - pt to be held for additional 30 min to observe s/p Dilaudid administration

## 2022-08-11 NOTE — ED Triage Notes (Signed)
Pt with chronic back pain is here for severe left lower back pain which began one week ago when she tripped and twisted.  Pt is brought in by EMS and in recliner.  Pt reports numbness in legs and feet since Thursday, no incontinence. Pt has been ambulatory to bathroom at home

## 2022-08-11 NOTE — Discharge Instructions (Signed)
Take the medications as prescribed to help with your pain.  Follow-up with your orthopedics doctor as planned

## 2022-08-12 DIAGNOSIS — M5416 Radiculopathy, lumbar region: Secondary | ICD-10-CM | POA: Diagnosis not present

## 2022-08-12 NOTE — ED Notes (Signed)
Pt agreeable with d/c plan as discussed by provider- this nurse has verbally reinforced d/c instructions and provided pt with written copy - pt acknowledges verbal understanding and denies any addl questions concerns needs- escorted to vehicle via w/c; mother at entrance waiting for patient in vehicle.

## 2022-08-18 DIAGNOSIS — M545 Low back pain, unspecified: Secondary | ICD-10-CM | POA: Diagnosis not present

## 2022-08-26 DIAGNOSIS — M5416 Radiculopathy, lumbar region: Secondary | ICD-10-CM | POA: Diagnosis not present

## 2022-08-28 ENCOUNTER — Other Ambulatory Visit: Payer: Self-pay | Admitting: Neurology

## 2022-09-02 DIAGNOSIS — M5416 Radiculopathy, lumbar region: Secondary | ICD-10-CM | POA: Diagnosis not present

## 2022-09-06 DIAGNOSIS — M5117 Intervertebral disc disorders with radiculopathy, lumbosacral region: Secondary | ICD-10-CM | POA: Diagnosis not present

## 2022-09-06 HISTORY — PX: LUMBAR DISC SURGERY: SHX700

## 2022-09-13 ENCOUNTER — Ambulatory Visit (INDEPENDENT_AMBULATORY_CARE_PROVIDER_SITE_OTHER): Payer: BC Managed Care – PPO | Admitting: Nurse Practitioner

## 2022-09-13 VITALS — BP 100/80 | HR 97 | Ht 64.0 in | Wt 175.0 lb

## 2022-09-13 DIAGNOSIS — K645 Perianal venous thrombosis: Secondary | ICD-10-CM | POA: Diagnosis not present

## 2022-09-13 MED ORDER — HYDROCORTISONE (PERIANAL) 2.5 % EX CREA: TOPICAL_CREAM | CUTANEOUS | 1 refills | Status: AC

## 2022-09-13 NOTE — Progress Notes (Signed)
Agree with assessment / plan as outlined.  

## 2022-09-13 NOTE — Patient Instructions (Signed)
We have sent the following medications to your pharmacy for you to pick up at your convenience: Anusol cream  Continue using your stool softeners 1-2 times a day.  Call if getting worse.   I appreciate the opportunity to care for you. Tye Savoy, NP-C

## 2022-09-13 NOTE — Progress Notes (Signed)
Assessment    Patient profile:  Amy Poole is a 52 y.o. female known to Dr. Havery Moros with a past medical history of adenomatous colon polyps, bradycardia, asthma. See PMH /PSH for additional history  # thrombosed external hemorrhoids / rectal bleeding. Once of the hemorrhoids had stigmata of recent bleeding. Recent constipation related to pain medication. Constipation now resolved off pain meds and taking stool softeners  # History of adenomatous colon polyps. Two small tubular adenomas removed in January 2023, 7-year follow-up colonoscopy was recommended  Plan   Warm tub soaks twice daily  Anusol cream to external hemorrhoids BID x 10 days Continue stool softeners daily  Continue drinking at least 60 oz water daily Follow up with me in two week for hemorrhoid recheck. She will call in the interim if getting worse as may need surgical evaluation   HPI    Chief complaint: hemorrhoids. Rectal bleeding   Had spinal surgery and recently on pain meds. Got very constipation. Struggling with hemorrhoids now. Having rectal bleeding for a few days. She has bled in the past as well. Off pain medications. A few years ago she had what sounds like hemorrhoidal banding which helped until just recently. She is using OTC creams to external hemorrhoids and it is helping. Since off pain meds and starting stools softeners the constipation has resolved.   Previous GI Evaluation  January 2023 screening colonoscopy. Adequate prep Diverticulosis in the transverse and left colon.  Two 2-4 millimeter polyps in the transverse colon.  Internal hemorrhoids Path - tubular adenomas. Follow-up colonoscopy in 7 years  Labs:     Latest Ref Rng & Units 07/05/2018    6:40 PM 04/15/2018    4:34 PM 02/27/2015    9:07 AM  CBC  WBC 4.0 - 10.5 K/uL 6.9  8.9  5.7   Hemoglobin 12.0 - 15.0 g/dL 12.6  13.6  13.1   Hematocrit 36.0 - 46.0 % 40.5  42.5  38.4   Platelets 150 - 400 K/uL 231  239  183         Latest Ref Rng & Units 02/27/2015    9:07 AM  Hepatic Function  Total Protein 6.0 - 8.3 g/dL 6.5   Albumin 3.5 - 5.2 g/dL 3.9   AST 0 - 37 U/L 14   ALT 0 - 35 U/L 16   Alk Phosphatase 39 - 117 U/L 53   Total Bilirubin 0.2 - 1.2 mg/dL 1.1      Past Medical History:  Diagnosis Date   Anxiety    Asthma    Bradycardia    Cancer (HCC)    skin cancer   Chest pain    Depression    Migraines    Syncope and collapse     Past Surgical History:  Procedure Laterality Date   APPENDECTOMY  07/07/1983   DOBUTAMINE STRESS ECHO  04/08/2010   NORMAL   TOE SURGERY      Current Medications, Allergies, Family History and Social History were reviewed in [employer] record.     Current Outpatient Medications  Medication Sig Dispense Refill   albuterol (PROVENTIL HFA;VENTOLIN HFA) 108 (90 BASE) MCG/ACT inhaler Inhale 2 puffs into the lungs every 4 (four) hours as needed for wheezing or shortness of breath (cough, shortness of breath or wheezing.). 1 Inhaler 11   CALCIUM PO Take by mouth.     cetirizine (ZYRTEC) 10 MG tablet Take 10 mg by mouth daily.  Erenumab-aooe (AIMOVIG) 140 MG/ML SOAJ Inject 140 mg into the skin every 30 (thirty) days. 1.12 mL 5   eszopiclone (LUNESTA) 1 MG TABS tablet Take by mouth at bedtime.     fluticasone-salmeterol (ADVAIR) 100-50 MCG/ACT AEPB INHALE 1 PUFF INTO THE LUNGS TWICE A DAY FOR 30 DAYS     gabapentin (NEURONTIN) 300 MG capsule Take 300 mg by mouth 3 (three) times daily.     HYDROcodone-acetaminophen (NORCO/VICODIN) 5-325 MG tablet Take 1 tablet by mouth every 6 (six) hours as needed for severe pain. 12 tablet 0   lidocaine (LIDODERM) 5 % Place 1 patch onto the skin daily. Remove & Discard patch within 12 hours or as directed by MD 10 patch 0   montelukast (SINGULAIR) 10 MG tablet Take 10 mg by mouth daily.     Multiple Vitamin (MULTIVITAMIN) tablet Take 1 tablet by mouth daily.     norethindrone (AYGESTIN) 5 MG tablet Take by  mouth.     ondansetron (ZOFRAN) 4 MG tablet Take 1 tablet (4 mg total) by mouth as directed. Take one Zofran pill 30-60 minutes before each colonoscopy prep dose 2 tablet 0   predniSONE (DELTASONE) 50 MG tablet Take 1 tablet (50 mg total) by mouth daily. 5 tablet 0   Probiotic Product (PROBIOTIC PO) Take by mouth.     rizatriptan (MAXALT) 10 MG tablet TAKE 1 TABLET BY MOUTH AS NEEDED FOR MIGRAINE. MAY REPEAT IN 2 HOURS IF NEEDED.  MAXIMUM 2 TABLETS IN 24 HRS 10 tablet 3   sertraline (ZOLOFT) 50 MG tablet TAKE 1 (ONE) TABLET AND MAY INCREASE TO 2 TABS DAILY  3   No current facility-administered medications for this visit.    Review of Systems: No chest pain. No shortness of breath. No urinary complaints.    Physical Exam  Wt Readings from Last 3 Encounters:  09/29/21 168 lb (76.2 kg)  09/15/21 168 lb (76.2 kg)  08/12/21 168 lb 9.6 oz (76.5 kg)    BP 100/80   Pulse 97   Ht '5\' 4"'$  (1.626 m)   Wt 175 lb (79.4 kg)   LMP  (LMP Unknown)   BMI 30.04 kg/m  Constitutional:  Generally well appearing female in no acute distress. Psychiatric: Pleasant. Normal mood and affect. Behavior is normal. Cardiovascular: Normal rate, regular rhythm.  Pulmonary/chest: Effort normal and breath sounds normal. No wheezing, rales or rhonchi. Abdominal: Soft, nondistended, nontender. Bowel sounds active throughout. There are no masses palpable. No hepatomegaly. Rectal: Large, swollen external hemorrhoids with stigmata of recent bleeding. DRE not done due to her discomfort Neurological: Alert and oriented to person place and time. Musculoskeletal: No edema Skin: Skin is warm and dry. No rashes noted.  Tye Savoy, NP  09/13/2022, 8:11 AM

## 2022-09-22 DIAGNOSIS — F332 Major depressive disorder, recurrent severe without psychotic features: Secondary | ICD-10-CM | POA: Diagnosis not present

## 2022-09-22 DIAGNOSIS — F411 Generalized anxiety disorder: Secondary | ICD-10-CM | POA: Diagnosis not present

## 2022-09-26 DIAGNOSIS — J029 Acute pharyngitis, unspecified: Secondary | ICD-10-CM | POA: Diagnosis not present

## 2022-09-26 DIAGNOSIS — J45909 Unspecified asthma, uncomplicated: Secondary | ICD-10-CM | POA: Diagnosis not present

## 2022-09-26 DIAGNOSIS — R051 Acute cough: Secondary | ICD-10-CM | POA: Diagnosis not present

## 2022-09-26 DIAGNOSIS — J069 Acute upper respiratory infection, unspecified: Secondary | ICD-10-CM | POA: Diagnosis not present

## 2022-09-28 DIAGNOSIS — U071 COVID-19: Secondary | ICD-10-CM | POA: Diagnosis not present

## 2022-09-28 DIAGNOSIS — R0981 Nasal congestion: Secondary | ICD-10-CM | POA: Diagnosis not present

## 2022-09-28 DIAGNOSIS — R051 Acute cough: Secondary | ICD-10-CM | POA: Diagnosis not present

## 2022-10-04 ENCOUNTER — Ambulatory Visit: Payer: BC Managed Care – PPO | Admitting: Nurse Practitioner

## 2022-10-25 ENCOUNTER — Ambulatory Visit (INDEPENDENT_AMBULATORY_CARE_PROVIDER_SITE_OTHER): Payer: BC Managed Care – PPO | Admitting: Gastroenterology

## 2022-10-25 ENCOUNTER — Encounter: Payer: Self-pay | Admitting: Gastroenterology

## 2022-10-25 VITALS — BP 110/60 | HR 95 | Ht 64.0 in | Wt 188.0 lb

## 2022-10-25 DIAGNOSIS — K644 Residual hemorrhoidal skin tags: Secondary | ICD-10-CM

## 2022-10-25 NOTE — Patient Instructions (Addendum)
_______________________________________________________  If your blood pressure at your visit was 140/90 or greater, please contact your primary care physician to follow up on this.  _______________________________________________________  If you are age 51 or older, your body mass index should be between 23-30. Your Body mass index is 32.27 kg/m. If this is out of the aforementioned range listed, please consider follow up with your Primary Care Provider.  If you are age 67 or younger, your body mass index should be between 19-25. Your Body mass index is 32.27 kg/m. If this is out of the aformentioned range listed, please consider follow up with your Primary Care Provider.   ________________________________________________________  The [facility] providers would like to encourage you to use [facility] to communicate with providers for non-urgent requests or questions.  Due to long hold times on the telephone, sending your provider a message by [facility] may be a faster and more efficient way to get a response.  Please allow 48 business hours for a response.  Please remember that this is for non-urgent requests.  _______________________________________________________  Continue hydrocortisone cream as needed   Follow up as needed   Thank you for entrusting me with your care and choosing [facility].  Alonza Bogus PA

## 2022-10-25 NOTE — Progress Notes (Signed)
Agree with assessment / plan as outlined.  

## 2022-10-25 NOTE — Progress Notes (Signed)
10/25/2022 RADENE LIMONE [id number] 03-29-1971   HISTORY OF PRESENT ILLNESS:  This is a pleasant 52 year old female who is a patient of Dr. Doyne Keel.  She was seen here in January for a thrombosed external hemorrhoid.  This was provoked by constipation as she had been taking pain medication.  She is now off of the pain medication and is moving her bowels well with occasional stool softener as needed.  Her hemorrhoid is much improved, about 90%.  She had been scheduled for follow-up with Nevin Bloodgood in January but had to reschedule because she had Covid so is here to see me today for follow-up.  Had colonoscopy 09/2021.   Past Medical History:  Diagnosis Date   Anxiety    Asthma    Bradycardia    Cancer (HCC)    skin cancer   Chest pain    Depression    Migraines    Syncope and collapse    Past Surgical History:  Procedure Laterality Date   APPENDECTOMY  07/07/1983   COLONOSCOPY     DOBUTAMINE STRESS ECHO  04/08/2010   NORMAL   LUMBAR [place] SURGERY  09/06/2022   TOE SURGERY      reports that she has never smoked. She has never used smokeless tobacco. She reports that she does not currently use alcohol. She reports that she does not use drugs. family history includes Breast cancer in her paternal grandmother; Cancer in her paternal grandmother; Colon cancer (age of onset: 18) in her mother; Colon polyps in her father; Diabetes in her father; Heart disease in her father; Hypertension in her mother. Allergies  Allergen Reactions   Codeine Nausea And Vomiting   Penicillins Nausea And Vomiting      Outpatient Encounter Medications as of 10/25/2022  Medication Sig   albuterol (PROVENTIL HFA;VENTOLIN HFA) 108 (90 BASE) MCG/ACT inhaler Inhale 2 puffs into the lungs every 4 (four) hours as needed for wheezing or shortness of breath (cough, shortness of breath or wheezing.).   CALCIUM PO Take by mouth.   doxycycline (MONODOX) 100 MG capsule Take 100 mg by mouth 2 (two) times daily.    Erenumab-aooe (AIMOVIG) 140 MG/ML SOAJ Inject 140 mg into the skin every 30 (thirty) days.   eszopiclone (LUNESTA) 1 MG TABS tablet Take by mouth at bedtime.   fluticasone-salmeterol (ADVAIR) 100-50 MCG/ACT AEPB INHALE 1 PUFF INTO THE LUNGS TWICE A DAY FOR 30 DAYS   gabapentin (NEURONTIN) 300 MG capsule Take 300 mg by mouth 3 (three) times daily.   hydrocortisone (ANUSOL-HC) 2.5 % rectal cream Apply externally to rectum twice a day for 10 days.   meloxicam (MOBIC) 15 MG tablet Take 15 mg by mouth daily.   methocarbamol (ROBAXIN) 500 MG tablet Take 500-1,000 mg by mouth every 6 (six) hours as needed.   metroNIDAZOLE (FLAGYL) 500 MG tablet Take 500 mg by mouth 3 (three) times daily.   Multiple Vitamin (MULTIVITAMIN) tablet Take 1 tablet by mouth daily.   mupirocin ointment (BACTROBAN) 2 % Apply 1 Application topically 3 (three) times daily.   norethindrone (AYGESTIN) 5 MG tablet Take by mouth.   Probiotic Product (PROBIOTIC PO) Take by mouth.   rizatriptan (MAXALT) 10 MG tablet TAKE 1 TABLET BY MOUTH AS NEEDED FOR MIGRAINE. MAY REPEAT IN 2 HOURS IF NEEDED.  MAXIMUM 2 TABLETS IN 24 HRS   [DISCONTINUED] cetirizine (ZYRTEC) 10 MG tablet Take 10 mg by mouth daily.   [DISCONTINUED] HYDROcodone-acetaminophen (NORCO/VICODIN) 5-325 MG tablet Take 1 tablet  by mouth every 6 (six) hours as needed for severe pain.   [DISCONTINUED] lidocaine (LIDODERM) 5 % Place 1 patch onto the skin daily. Remove & Discard patch within 12 hours or as directed by MD   [DISCONTINUED] montelukast (SINGULAIR) 10 MG tablet Take 10 mg by mouth daily.   [DISCONTINUED] ondansetron (ZOFRAN) 4 MG tablet Take 1 tablet (4 mg total) by mouth as directed. Take one Zofran pill 30-60 minutes before each colonoscopy prep dose (Patient not taking: Reported on 09/13/2022)   [DISCONTINUED] predniSONE (DELTASONE) 50 MG tablet Take 1 tablet (50 mg total) by mouth daily.   [DISCONTINUED] sertraline (ZOLOFT) 50 MG tablet TAKE 1 (ONE) TABLET AND MAY  INCREASE TO 2 TABS DAILY   No facility-administered encounter medications on file as of 10/25/2022.    REVIEW OF SYSTEMS  : All other systems reviewed and negative except where noted in the History of Present Illness.   PHYSICAL EXAM: BP 110/60   Pulse 95   Ht 5' 4"$  (1.626 m)   Wt 188 lb (85.3 kg)   BMI 32.27 kg/m  General: Well developed white female in no acute distress Head: Normocephalic and atraumatic Eyes:  Sclerae anicteric, conjunctiva pink. Ears: Normal auditory acuity Lungs: Clear throughout to auscultation; no W/R/R. Heart: Regular rate and rhythm; no M/R/G. Abdomen: Soft, non-distended.  BS present.  Non-tender. Musculoskeletal: Symmetrical with no gross deformities  Skin: No lesions on visible extremities Extremities: No edema  Neurological: Alert oriented x 4, grossly non-focal Psychological:  Alert and cooperative. Normal mood and affect  ASSESSMENT AND PLAN: *Thrombosed external hemorrhoid:  Much improved, 90%.  Was provoked by constipation from taking pain medications.  Now off of pain medications and moving bowels well.  Continue hydrocortisone prn.  Follow-up prn.   CC:  Crist Infante, MD

## 2022-10-31 DIAGNOSIS — M5416 Radiculopathy, lumbar region: Secondary | ICD-10-CM | POA: Diagnosis not present

## 2022-11-01 DIAGNOSIS — Z5189 Encounter for other specified aftercare: Secondary | ICD-10-CM | POA: Diagnosis not present

## 2022-11-01 DIAGNOSIS — L089 Local infection of the skin and subcutaneous tissue, unspecified: Secondary | ICD-10-CM | POA: Diagnosis not present

## 2022-11-01 DIAGNOSIS — W540XXA Bitten by dog, initial encounter: Secondary | ICD-10-CM | POA: Diagnosis not present

## 2022-11-14 DIAGNOSIS — F411 Generalized anxiety disorder: Secondary | ICD-10-CM | POA: Diagnosis not present

## 2022-11-14 DIAGNOSIS — F332 Major depressive disorder, recurrent severe without psychotic features: Secondary | ICD-10-CM | POA: Diagnosis not present

## 2022-11-23 DIAGNOSIS — M25512 Pain in left shoulder: Secondary | ICD-10-CM | POA: Diagnosis not present

## 2022-12-23 DIAGNOSIS — R739 Hyperglycemia, unspecified: Secondary | ICD-10-CM | POA: Diagnosis not present

## 2022-12-23 DIAGNOSIS — E785 Hyperlipidemia, unspecified: Secondary | ICD-10-CM | POA: Diagnosis not present

## 2022-12-27 DIAGNOSIS — F411 Generalized anxiety disorder: Secondary | ICD-10-CM | POA: Diagnosis not present

## 2022-12-27 DIAGNOSIS — F332 Major depressive disorder, recurrent severe without psychotic features: Secondary | ICD-10-CM | POA: Diagnosis not present

## 2022-12-30 ENCOUNTER — Other Ambulatory Visit: Payer: Self-pay | Admitting: Internal Medicine

## 2022-12-30 DIAGNOSIS — Z1331 Encounter for screening for depression: Secondary | ICD-10-CM | POA: Diagnosis not present

## 2022-12-30 DIAGNOSIS — Z0000 Encounter for general adult medical examination without abnormal findings: Secondary | ICD-10-CM | POA: Diagnosis not present

## 2022-12-30 DIAGNOSIS — J45909 Unspecified asthma, uncomplicated: Secondary | ICD-10-CM | POA: Diagnosis not present

## 2022-12-30 DIAGNOSIS — J309 Allergic rhinitis, unspecified: Secondary | ICD-10-CM | POA: Diagnosis not present

## 2022-12-30 DIAGNOSIS — E785 Hyperlipidemia, unspecified: Secondary | ICD-10-CM

## 2022-12-30 DIAGNOSIS — G43909 Migraine, unspecified, not intractable, without status migrainosus: Secondary | ICD-10-CM | POA: Diagnosis not present

## 2022-12-30 DIAGNOSIS — M1990 Unspecified osteoarthritis, unspecified site: Secondary | ICD-10-CM | POA: Diagnosis not present

## 2022-12-30 DIAGNOSIS — R82998 Other abnormal findings in urine: Secondary | ICD-10-CM | POA: Diagnosis not present

## 2023-02-03 ENCOUNTER — Ambulatory Visit
Admission: RE | Admit: 2023-02-03 | Discharge: 2023-02-03 | Disposition: A | Discharge status: Home / Self Care | Payer: No Typology Code available for payment source | Source: Ambulatory Visit | Attending: Internal Medicine | Admitting: Internal Medicine

## 2023-02-03 DIAGNOSIS — E785 Hyperlipidemia, unspecified: Secondary | ICD-10-CM

## 2023-02-10 DIAGNOSIS — D2262 Melanocytic nevi of left upper limb, including shoulder: Secondary | ICD-10-CM | POA: Diagnosis not present

## 2023-02-10 DIAGNOSIS — D2271 Melanocytic nevi of right lower limb, including hip: Secondary | ICD-10-CM | POA: Diagnosis not present

## 2023-02-10 DIAGNOSIS — Z85828 Personal history of other malignant neoplasm of skin: Secondary | ICD-10-CM | POA: Diagnosis not present

## 2023-02-10 DIAGNOSIS — D2261 Melanocytic nevi of right upper limb, including shoulder: Secondary | ICD-10-CM | POA: Diagnosis not present

## 2023-04-25 DIAGNOSIS — F411 Generalized anxiety disorder: Secondary | ICD-10-CM | POA: Diagnosis not present

## 2023-04-25 DIAGNOSIS — F332 Major depressive disorder, recurrent severe without psychotic features: Secondary | ICD-10-CM | POA: Diagnosis not present

## 2023-04-27 DIAGNOSIS — H5310 Unspecified subjective visual disturbances: Secondary | ICD-10-CM | POA: Diagnosis not present

## 2023-04-28 DIAGNOSIS — G43909 Migraine, unspecified, not intractable, without status migrainosus: Secondary | ICD-10-CM | POA: Diagnosis not present

## 2023-04-28 DIAGNOSIS — H5310 Unspecified subjective visual disturbances: Secondary | ICD-10-CM | POA: Diagnosis not present

## 2023-05-10 DIAGNOSIS — D692 Other nonthrombocytopenic purpura: Secondary | ICD-10-CM | POA: Diagnosis not present

## 2023-05-17 DIAGNOSIS — M25561 Pain in right knee: Secondary | ICD-10-CM | POA: Diagnosis not present

## 2023-05-29 DIAGNOSIS — F332 Major depressive disorder, recurrent severe without psychotic features: Secondary | ICD-10-CM | POA: Diagnosis not present

## 2023-05-29 DIAGNOSIS — F411 Generalized anxiety disorder: Secondary | ICD-10-CM | POA: Diagnosis not present

## 2023-07-10 DIAGNOSIS — H6692 Otitis media, unspecified, left ear: Secondary | ICD-10-CM | POA: Diagnosis not present

## 2023-07-18 DIAGNOSIS — R42 Dizziness and giddiness: Secondary | ICD-10-CM | POA: Diagnosis not present

## 2023-07-18 DIAGNOSIS — Z23 Encounter for immunization: Secondary | ICD-10-CM | POA: Diagnosis not present

## 2023-07-31 DIAGNOSIS — R0981 Nasal congestion: Secondary | ICD-10-CM | POA: Diagnosis not present

## 2023-07-31 DIAGNOSIS — R058 Other specified cough: Secondary | ICD-10-CM | POA: Diagnosis not present

## 2023-07-31 DIAGNOSIS — J209 Acute bronchitis, unspecified: Secondary | ICD-10-CM | POA: Diagnosis not present

## 2023-08-01 DIAGNOSIS — R87810 Cervical high risk human papillomavirus (HPV) DNA test positive: Secondary | ICD-10-CM | POA: Diagnosis not present

## 2023-08-01 DIAGNOSIS — H5213 Myopia, bilateral: Secondary | ICD-10-CM | POA: Diagnosis not present

## 2023-08-01 DIAGNOSIS — Z30432 Encounter for removal of intrauterine contraceptive device: Secondary | ICD-10-CM | POA: Diagnosis not present

## 2023-08-01 DIAGNOSIS — G43909 Migraine, unspecified, not intractable, without status migrainosus: Secondary | ICD-10-CM | POA: Diagnosis not present

## 2023-08-01 DIAGNOSIS — Z1331 Encounter for screening for depression: Secondary | ICD-10-CM | POA: Diagnosis not present

## 2023-08-01 DIAGNOSIS — Z124 Encounter for screening for malignant neoplasm of cervix: Secondary | ICD-10-CM | POA: Diagnosis not present

## 2023-08-01 DIAGNOSIS — Z1231 Encounter for screening mammogram for malignant neoplasm of breast: Secondary | ICD-10-CM | POA: Diagnosis not present

## 2023-08-01 DIAGNOSIS — Z01419 Encounter for gynecological examination (general) (routine) without abnormal findings: Secondary | ICD-10-CM | POA: Diagnosis not present

## 2023-08-22 DIAGNOSIS — F3341 Major depressive disorder, recurrent, in partial remission: Secondary | ICD-10-CM | POA: Diagnosis not present

## 2023-08-22 DIAGNOSIS — G43009 Migraine without aura, not intractable, without status migrainosus: Secondary | ICD-10-CM | POA: Diagnosis not present

## 2023-08-22 DIAGNOSIS — Z131 Encounter for screening for diabetes mellitus: Secondary | ICD-10-CM | POA: Diagnosis not present

## 2023-08-22 DIAGNOSIS — Z130 Encounter for screening for diseases of the blood and blood-forming organs and certain disorders involving the immune mechanism: Secondary | ICD-10-CM | POA: Diagnosis not present

## 2023-08-22 DIAGNOSIS — J4540 Moderate persistent asthma, uncomplicated: Secondary | ICD-10-CM | POA: Diagnosis not present

## 2023-08-22 DIAGNOSIS — N951 Menopausal and female climacteric states: Secondary | ICD-10-CM | POA: Diagnosis not present

## 2023-08-22 DIAGNOSIS — E7800 Pure hypercholesterolemia, unspecified: Secondary | ICD-10-CM | POA: Diagnosis not present

## 2023-08-22 DIAGNOSIS — E669 Obesity, unspecified: Secondary | ICD-10-CM | POA: Diagnosis not present

## 2023-08-22 DIAGNOSIS — E039 Hypothyroidism, unspecified: Secondary | ICD-10-CM | POA: Diagnosis not present

## 2023-08-24 DIAGNOSIS — E039 Hypothyroidism, unspecified: Secondary | ICD-10-CM | POA: Diagnosis not present

## 2023-08-24 DIAGNOSIS — E669 Obesity, unspecified: Secondary | ICD-10-CM | POA: Diagnosis not present

## 2023-08-24 DIAGNOSIS — E7800 Pure hypercholesterolemia, unspecified: Secondary | ICD-10-CM | POA: Diagnosis not present

## 2023-08-24 DIAGNOSIS — F3341 Major depressive disorder, recurrent, in partial remission: Secondary | ICD-10-CM | POA: Diagnosis not present

## 2023-08-24 DIAGNOSIS — Z1331 Encounter for screening for depression: Secondary | ICD-10-CM | POA: Diagnosis not present

## 2023-09-08 DIAGNOSIS — E7800 Pure hypercholesterolemia, unspecified: Secondary | ICD-10-CM | POA: Diagnosis not present

## 2023-09-08 DIAGNOSIS — E039 Hypothyroidism, unspecified: Secondary | ICD-10-CM | POA: Diagnosis not present

## 2023-09-08 DIAGNOSIS — Z6830 Body mass index (BMI) 30.0-30.9, adult: Secondary | ICD-10-CM | POA: Diagnosis not present

## 2023-09-18 DIAGNOSIS — E7800 Pure hypercholesterolemia, unspecified: Secondary | ICD-10-CM | POA: Diagnosis not present

## 2023-09-18 DIAGNOSIS — Z6830 Body mass index (BMI) 30.0-30.9, adult: Secondary | ICD-10-CM | POA: Diagnosis not present

## 2023-09-18 DIAGNOSIS — E039 Hypothyroidism, unspecified: Secondary | ICD-10-CM | POA: Diagnosis not present

## 2023-09-26 DIAGNOSIS — F332 Major depressive disorder, recurrent severe without psychotic features: Secondary | ICD-10-CM | POA: Diagnosis not present

## 2023-09-26 DIAGNOSIS — F411 Generalized anxiety disorder: Secondary | ICD-10-CM | POA: Diagnosis not present

## 2023-09-27 DIAGNOSIS — Z6830 Body mass index (BMI) 30.0-30.9, adult: Secondary | ICD-10-CM | POA: Diagnosis not present

## 2023-09-27 DIAGNOSIS — E7800 Pure hypercholesterolemia, unspecified: Secondary | ICD-10-CM | POA: Diagnosis not present

## 2023-10-06 DIAGNOSIS — E039 Hypothyroidism, unspecified: Secondary | ICD-10-CM | POA: Diagnosis not present

## 2023-10-06 DIAGNOSIS — Z6829 Body mass index (BMI) 29.0-29.9, adult: Secondary | ICD-10-CM | POA: Diagnosis not present

## 2023-10-12 DIAGNOSIS — E7800 Pure hypercholesterolemia, unspecified: Secondary | ICD-10-CM | POA: Diagnosis not present

## 2023-10-12 DIAGNOSIS — Z6829 Body mass index (BMI) 29.0-29.9, adult: Secondary | ICD-10-CM | POA: Diagnosis not present

## 2023-10-12 DIAGNOSIS — E559 Vitamin D deficiency, unspecified: Secondary | ICD-10-CM | POA: Diagnosis not present

## 2023-10-12 DIAGNOSIS — R635 Abnormal weight gain: Secondary | ICD-10-CM | POA: Diagnosis not present

## 2023-10-12 DIAGNOSIS — E039 Hypothyroidism, unspecified: Secondary | ICD-10-CM | POA: Diagnosis not present

## 2023-10-12 DIAGNOSIS — N951 Menopausal and female climacteric states: Secondary | ICD-10-CM | POA: Diagnosis not present

## 2023-11-02 DIAGNOSIS — E039 Hypothyroidism, unspecified: Secondary | ICD-10-CM | POA: Diagnosis not present

## 2023-11-02 DIAGNOSIS — Z6828 Body mass index (BMI) 28.0-28.9, adult: Secondary | ICD-10-CM | POA: Diagnosis not present

## 2023-11-02 DIAGNOSIS — E7800 Pure hypercholesterolemia, unspecified: Secondary | ICD-10-CM | POA: Diagnosis not present

## 2023-11-02 DIAGNOSIS — E559 Vitamin D deficiency, unspecified: Secondary | ICD-10-CM | POA: Diagnosis not present

## 2023-11-16 DIAGNOSIS — E7800 Pure hypercholesterolemia, unspecified: Secondary | ICD-10-CM | POA: Diagnosis not present

## 2023-11-16 DIAGNOSIS — Z6827 Body mass index (BMI) 27.0-27.9, adult: Secondary | ICD-10-CM | POA: Diagnosis not present

## 2023-12-01 DIAGNOSIS — E7800 Pure hypercholesterolemia, unspecified: Secondary | ICD-10-CM | POA: Diagnosis not present

## 2023-12-01 DIAGNOSIS — Z6827 Body mass index (BMI) 27.0-27.9, adult: Secondary | ICD-10-CM | POA: Diagnosis not present

## 2023-12-12 DIAGNOSIS — M542 Cervicalgia: Secondary | ICD-10-CM | POA: Diagnosis not present

## 2023-12-15 DIAGNOSIS — F3341 Major depressive disorder, recurrent, in partial remission: Secondary | ICD-10-CM | POA: Diagnosis not present

## 2023-12-15 DIAGNOSIS — Z6826 Body mass index (BMI) 26.0-26.9, adult: Secondary | ICD-10-CM | POA: Diagnosis not present

## 2023-12-26 DIAGNOSIS — F332 Major depressive disorder, recurrent severe without psychotic features: Secondary | ICD-10-CM | POA: Diagnosis not present

## 2023-12-26 DIAGNOSIS — F411 Generalized anxiety disorder: Secondary | ICD-10-CM | POA: Diagnosis not present

## 2024-01-05 DIAGNOSIS — E7800 Pure hypercholesterolemia, unspecified: Secondary | ICD-10-CM | POA: Diagnosis not present

## 2024-01-05 DIAGNOSIS — Z6824 Body mass index (BMI) 24.0-24.9, adult: Secondary | ICD-10-CM | POA: Diagnosis not present

## 2024-02-05 DIAGNOSIS — R058 Other specified cough: Secondary | ICD-10-CM | POA: Diagnosis not present

## 2024-02-05 DIAGNOSIS — J45909 Unspecified asthma, uncomplicated: Secondary | ICD-10-CM | POA: Diagnosis not present

## 2024-02-09 DIAGNOSIS — E7800 Pure hypercholesterolemia, unspecified: Secondary | ICD-10-CM | POA: Diagnosis not present

## 2024-02-09 DIAGNOSIS — E559 Vitamin D deficiency, unspecified: Secondary | ICD-10-CM | POA: Diagnosis not present

## 2024-02-09 DIAGNOSIS — Z6825 Body mass index (BMI) 25.0-25.9, adult: Secondary | ICD-10-CM | POA: Diagnosis not present

## 2024-02-16 DIAGNOSIS — D2261 Melanocytic nevi of right upper limb, including shoulder: Secondary | ICD-10-CM | POA: Diagnosis not present

## 2024-02-16 DIAGNOSIS — L570 Actinic keratosis: Secondary | ICD-10-CM | POA: Diagnosis not present

## 2024-02-16 DIAGNOSIS — D2262 Melanocytic nevi of left upper limb, including shoulder: Secondary | ICD-10-CM | POA: Diagnosis not present

## 2024-02-16 DIAGNOSIS — Z85828 Personal history of other malignant neoplasm of skin: Secondary | ICD-10-CM | POA: Diagnosis not present

## 2024-03-13 DIAGNOSIS — Z1389 Encounter for screening for other disorder: Secondary | ICD-10-CM | POA: Diagnosis not present

## 2024-03-13 DIAGNOSIS — Z1212 Encounter for screening for malignant neoplasm of rectum: Secondary | ICD-10-CM | POA: Diagnosis not present

## 2024-03-13 DIAGNOSIS — E785 Hyperlipidemia, unspecified: Secondary | ICD-10-CM | POA: Diagnosis not present

## 2024-03-21 DIAGNOSIS — Z0000 Encounter for general adult medical examination without abnormal findings: Secondary | ICD-10-CM | POA: Diagnosis not present

## 2024-03-21 DIAGNOSIS — J45909 Unspecified asthma, uncomplicated: Secondary | ICD-10-CM | POA: Diagnosis not present

## 2024-03-28 ENCOUNTER — Ambulatory Visit: Admitting: Physician Assistant

## 2024-04-26 DIAGNOSIS — M4696 Unspecified inflammatory spondylopathy, lumbar region: Secondary | ICD-10-CM | POA: Diagnosis not present

## 2024-04-26 DIAGNOSIS — M545 Low back pain, unspecified: Secondary | ICD-10-CM | POA: Diagnosis not present

## 2024-05-27 DIAGNOSIS — F332 Major depressive disorder, recurrent severe without psychotic features: Secondary | ICD-10-CM | POA: Diagnosis not present

## 2024-05-27 DIAGNOSIS — F411 Generalized anxiety disorder: Secondary | ICD-10-CM | POA: Diagnosis not present

## 2024-06-24 DIAGNOSIS — M4712 Other spondylosis with myelopathy, cervical region: Secondary | ICD-10-CM | POA: Diagnosis not present

## 2024-07-04 DIAGNOSIS — M542 Cervicalgia: Secondary | ICD-10-CM | POA: Diagnosis not present

## 2024-07-12 DIAGNOSIS — G959 Disease of spinal cord, unspecified: Secondary | ICD-10-CM | POA: Diagnosis not present

## 2024-08-20 DIAGNOSIS — F411 Generalized anxiety disorder: Secondary | ICD-10-CM | POA: Diagnosis not present

## 2024-08-20 DIAGNOSIS — F332 Major depressive disorder, recurrent severe without psychotic features: Secondary | ICD-10-CM | POA: Diagnosis not present
